# Patient Record
Sex: Male | Born: 1975
Health system: Southern US, Community
[De-identification: ages and names within clinical notes are randomized; demographics above are authoritative.]

## PROBLEM LIST (undated history)

## (undated) DIAGNOSIS — G4730 Sleep apnea, unspecified: Secondary | ICD-10-CM

## (undated) HISTORY — DX: Sleep apnea, unspecified: G47.30

---

## 2010-12-21 ENCOUNTER — Other Ambulatory Visit: Payer: Self-pay | Admitting: Internal Medicine

## 2010-12-30 ENCOUNTER — Ambulatory Visit
Admission: RE | Admit: 2010-12-30 | Discharge: 2010-12-30 | Disposition: A | Discharge status: Home / Self Care | Payer: 59 | Source: Ambulatory Visit | Attending: Internal Medicine | Admitting: Internal Medicine

## 2014-03-20 ENCOUNTER — Ambulatory Visit (INDEPENDENT_AMBULATORY_CARE_PROVIDER_SITE_OTHER): Payer: BLUE CROSS/BLUE SHIELD | Admitting: Family Medicine

## 2014-03-20 ENCOUNTER — Encounter: Payer: Self-pay | Admitting: Family Medicine

## 2014-03-20 VITALS — BP 138/82 | HR 80 | Ht 73.0 in | Wt 260.0 lb

## 2014-03-20 DIAGNOSIS — M9903 Segmental and somatic dysfunction of lumbar region: Secondary | ICD-10-CM

## 2014-03-20 DIAGNOSIS — M999 Biomechanical lesion, unspecified: Secondary | ICD-10-CM

## 2014-03-20 DIAGNOSIS — M9902 Segmental and somatic dysfunction of thoracic region: Secondary | ICD-10-CM

## 2014-03-20 DIAGNOSIS — M9904 Segmental and somatic dysfunction of sacral region: Secondary | ICD-10-CM

## 2014-03-20 DIAGNOSIS — G5702 Lesion of sciatic nerve, left lower limb: Secondary | ICD-10-CM

## 2014-03-20 NOTE — Progress Notes (Signed)
Pre visit review using our clinic review tool, if applicable. No additional management support is needed unless otherwise documented below in the visit note. 

## 2014-03-20 NOTE — Assessment & Plan Note (Signed)
Decision today to treat with OMT was based on Physical Exam  After verbal consent patient was treated with HVLA, ME techniques in cervical, lumbar and thoracic areas  Patient tolerated the procedure well with improvement in symptoms  Patient given exercises, stretches and lifestyle modifications  See medications in patient instructions if given  Patient will follow up in 3 weeks

## 2014-03-20 NOTE — Patient Instructions (Signed)
Good to see you Tennisball back left pocket with sitting Ice 20 minutes 2 times daily. Usually after activity and before bed when needed Consider Vitamin D 2000 IU daily Consider turmeric 500mg  twice daily Exercises 3 times a week.  Good shoes can always help See me agai nin 3 weeks.

## 2014-03-20 NOTE — Assessment & Plan Note (Signed)
Piriformis Syndrome  Using an anatomical model, reviewed with the patient the structures involved and how they related to diagnosis. The patient indicated understanding.   The patient was given a handout from Dr. Arne Cleveland book "The Sports Medicine Patient Advisor" describing the anatomy and rehabilitation of the following condition: Piriformis Syndrome  Also given a handout with more extensive Piriformis stretching, hip flexor and abductor strengthening, ham stretching  Rec deep massage, explained self-massage with ball  Patient will work on the hip abductor exercises and will come back and see me again in 3 weeks.

## 2014-03-20 NOTE — Progress Notes (Signed)
  [facility] [address] [place], [place] [address] Phone: [phone] Subjective:     CC: Back pain  [id number] Jeremy Warren is a 39 y.o. male coming in with complaint of  Left-sided back pain. Patient has had this pain intimately for quite some time. Patient does have the pain usually in his left side of his back and seems to radiate down his leg intermittently. Patient states recently it seems to be more frequent. Patient describes it as a dull throbbing aching sensation. Patient denies any significant weakness. This is not stopping him from any activities but it does change some of his daily activities. Patient states that he only sleeps on his right side because it feels better when he is stretching the left side. Patient has started yoga classes which has been helpful. Patient rates the severity of pain as 5 out of 10. Patient is not tried any home modalities.     Past medical history, social, surgical and family history all reviewed in electronic medical record.   Review of Systems: No headache, visual changes, nausea, vomiting, diarrhea, constipation, dizziness, abdominal pain, skin rash, fevers, chills, night sweats, weight loss, swollen lymph nodes, body aches, joint swelling, muscle aches, chest pain, shortness of breath, mood changes.   Objective Blood pressure 138/82, pulse 80, height 6\' 1"  (1.854 m), weight 260 lb (117.935 kg), SpO2 97 %.  General: No apparent distress alert and oriented x3 mood and affect normal, dressed appropriately.  HEENT: Pupils equal, extraocular movements intact  Respiratory: Patient's speak in full sentences and does not appear short of breath  Cardiovascular: No lower extremity edema, non tender, no erythema  Skin: Warm dry intact with no signs of infection or rash on extremities or on axial skeleton.  Abdomen: Soft nontender  Neuro: Cranial nerves II through XII are intact, neurovascularly intact in all  extremities with 2+ DTRs and 2+ pulses.  Lymph: No lymphadenopathy of posterior or anterior cervical chain or axillae bilaterally.  Gait normal with good balance and coordination.  MSK:  Non tender with full range of motion and good stability and symmetric strength and tone of shoulders, elbows, wrist, knee and ankles bilaterally.  Hip: Left ROM IR: 45 Deg, ER: 45 Deg, Flexion: 120 Deg, Extension: 100 Deg, Abduction: 45 Deg, Adduction: 45 Deg Strength IR: 5/5, ER: 5/5, Flexion: 5/5, Extension: 5/5, Abduction: 3/5, Adduction: 5/5 Pelvic alignment unremarkable to inspection and palpation. Standing hip rotation and gait without trendelenburg sign / unsteadiness. Greater trochanter without tenderness to palpation. Severe tenderness over the piriformis muscle on left side. Positive Faber No SI joint tenderness and normal minimal SI movement. Contralateral hip unremarkable.  OMT Physical Exam   Standing flexion left  Seated Flexion left  Cervical  Neutral  Thoracic  T5 extended rotated and side bent right  Lumbar L2 flexed rotated and side bent right  Sacrum Right on right   Procedure note 97110; 15 minutes spent for Therapeutic exercises as stated in above notes.  This included exercises focusing on stretching, strengthening, with significant focus on eccentric aspects.   Proper technique shown and discussed handout in great detail with ATC.  All questions were discussed and answered.      Impression and Recommendations:     This case required medical decision making of moderate complexity.   ;

## 2014-04-10 ENCOUNTER — Ambulatory Visit (INDEPENDENT_AMBULATORY_CARE_PROVIDER_SITE_OTHER): Payer: BLUE CROSS/BLUE SHIELD | Admitting: Family Medicine

## 2014-04-10 ENCOUNTER — Encounter: Payer: Self-pay | Admitting: Family Medicine

## 2014-04-10 VITALS — BP 132/80 | HR 82 | Ht 73.0 in | Wt 260.0 lb

## 2014-04-10 DIAGNOSIS — M9902 Segmental and somatic dysfunction of thoracic region: Secondary | ICD-10-CM

## 2014-04-10 DIAGNOSIS — M9903 Segmental and somatic dysfunction of lumbar region: Secondary | ICD-10-CM | POA: Diagnosis not present

## 2014-04-10 DIAGNOSIS — G5702 Lesion of sciatic nerve, left lower limb: Secondary | ICD-10-CM | POA: Diagnosis not present

## 2014-04-10 DIAGNOSIS — M9904 Segmental and somatic dysfunction of sacral region: Secondary | ICD-10-CM | POA: Diagnosis not present

## 2014-04-10 DIAGNOSIS — M999 Biomechanical lesion, unspecified: Secondary | ICD-10-CM

## 2014-04-10 NOTE — Progress Notes (Signed)
  [facility] [address] [place], [place] [address] Phone: [phone] Subjective:     CC: Back pain follow up  [id number] Jeremy Warren is a 39 y.o. male coming in with complaint of  Left-sided back pain. She was seen previously and had more of a piriformis syndrome. Patient was given home exercises, icing protocol and did respond well to osteopathic manipulation. Patient states he is doing about 90% better. Still very mild intermittent pain but overall feeling much better. Does respond well to the tennis ball. Doing the exercises regularly. Patient is not taking the vitamins regularly.     Past medical history, social, surgical and family history all reviewed in electronic medical record.   Review of Systems: No headache, visual changes, nausea, vomiting, diarrhea, constipation, dizziness, abdominal pain, skin rash, fevers, chills, night sweats, weight loss, swollen lymph nodes, body aches, joint swelling, muscle aches, chest pain, shortness of breath, mood changes.   Objective Blood pressure 132/80, pulse 82, height 6\' 1"  (1.854 m), weight 260 lb (117.935 kg), SpO2 97 %.  General: No apparent distress alert and oriented x3 mood and affect normal, dressed appropriately.  HEENT: Pupils equal, extraocular movements intact  Respiratory: Patient's speak in full sentences and does not appear short of breath  Cardiovascular: No lower extremity edema, non tender, no erythema  Skin: Warm dry intact with no signs of infection or rash on extremities or on axial skeleton.  Abdomen: Soft nontender  Neuro: Cranial nerves II through XII are intact, neurovascularly intact in all extremities with 2+ DTRs and 2+ pulses.  Lymph: No lymphadenopathy of posterior or anterior cervical chain or axillae bilaterally.  Gait normal with good balance and coordination.  MSK:  Non tender with full range of motion and good stability and symmetric strength and tone of shoulders,  elbows, wrist, knee and ankles bilaterally.  Hip: Left ROM IR: 45 Deg, ER: 45 Deg, Flexion: 110 Deg, Extension: 90 Deg, Abduction: 35 Deg, Adduction: 35 Deg Strength IR: 5/5, ER: 5/5, Flexion: 5/5, Extension: 5/5, Abduction: 3/5, Adduction: 5/5 Pelvic alignment unremarkable to inspection and palpation. Standing hip rotation and gait without trendelenburg sign / unsteadiness. Greater trochanter without tenderness to palpation. Severe tenderness over the piriformis muscle on left side. Positive Faber less than previous exam No SI joint tenderness and normal minimal SI movement. Contralateral hip unremarkable.  OMT Physical Exam   Cervical  Neutral  Thoracic  T5 extended rotated and side bent right  Lumbar L2 flexed rotated and side bent right  Sacrum Right on right      Impression and Recommendations:     This case required medical decision making of moderate complexity.   ;

## 2014-04-10 NOTE — Assessment & Plan Note (Signed)
Patient is doing significantly better at this time. Encourage patient to continue the home exercises 3 times a week for another 6 weeks. We discussed continuing the manual massage with the tennis ball as well. Patient does respond well to the osteopathic manipulation and we will have patient come back in 6 weeks for further evaluation and treatment.

## 2014-04-10 NOTE — Patient Instructions (Addendum)
Good to see you Tell me how you like the shoes Try maybe to get the vitamins onboard Tennis ball is your friend Continue the exercises 3 times a week See me again in 6 weeks.

## 2014-04-10 NOTE — Assessment & Plan Note (Signed)
Decision today to treat with OMT was based on Physical Exam  After verbal consent patient was treated with HVLA, ME techniques in cervical, lumbar and thoracic areas  Patient tolerated the procedure well with improvement in symptoms  Patient given exercises, stretches and lifestyle modifications  See medications in patient instructions if given  Patient will follow up in 6 weeks

## 2014-04-10 NOTE — Progress Notes (Signed)
Pre visit review using our clinic review tool, if applicable. No additional management support is needed unless otherwise documented below in the visit note. 

## 2014-05-21 ENCOUNTER — Ambulatory Visit (INDEPENDENT_AMBULATORY_CARE_PROVIDER_SITE_OTHER): Payer: BLUE CROSS/BLUE SHIELD | Admitting: Family Medicine

## 2014-05-21 ENCOUNTER — Encounter: Payer: Self-pay | Admitting: Family Medicine

## 2014-05-21 VITALS — BP 118/80 | HR 71 | Ht 73.0 in | Wt 260.0 lb

## 2014-05-21 DIAGNOSIS — G5702 Lesion of sciatic nerve, left lower limb: Secondary | ICD-10-CM | POA: Diagnosis not present

## 2014-05-21 DIAGNOSIS — M9902 Segmental and somatic dysfunction of thoracic region: Secondary | ICD-10-CM | POA: Diagnosis not present

## 2014-05-21 DIAGNOSIS — M9903 Segmental and somatic dysfunction of lumbar region: Secondary | ICD-10-CM

## 2014-05-21 DIAGNOSIS — M9904 Segmental and somatic dysfunction of sacral region: Secondary | ICD-10-CM | POA: Diagnosis not present

## 2014-05-21 DIAGNOSIS — M999 Biomechanical lesion, unspecified: Secondary | ICD-10-CM

## 2014-05-21 NOTE — Progress Notes (Signed)
Pre visit review using our clinic review tool, if applicable. No additional management support is needed unless otherwise documented below in the visit note. 

## 2014-05-21 NOTE — Progress Notes (Signed)
  [facility] [address] [place], [place] [address] Phone: [phone] Subjective:     CC: Back pain follow up  [id number] Martino Tompson is a 39 y.o. male coming in with complaint of  Left-sided back pain. She was seen previously and had more of a piriformis syndrome. Patient continues to do very well. Patient is having no pain down the leg. Patient states that it is very well controlled. Not having any issues this time.     Past medical history, social, surgical and family history all reviewed in electronic medical record.   Review of Systems: No headache, visual changes, nausea, vomiting, diarrhea, constipation, dizziness, abdominal pain, skin rash, fevers, chills, night sweats, weight loss, swollen lymph nodes, body aches, joint swelling, muscle aches, chest pain, shortness of breath, mood changes.   Objective Blood pressure 118/80, pulse 71, height 6\' 1"  (1.854 m), weight 260 lb (117.935 kg), SpO2 97 %.  General: No apparent distress alert and oriented x3 mood and affect normal, dressed appropriately.  HEENT: Pupils equal, extraocular movements intact  Respiratory: Patient's speak in full sentences and does not appear short of breath  Cardiovascular: No lower extremity edema, non tender, no erythema  Skin: Warm dry intact with no signs of infection or rash on extremities or on axial skeleton.  Abdomen: Soft nontender  Neuro: Cranial nerves II through XII are intact, neurovascularly intact in all extremities with 2+ DTRs and 2+ pulses.  Lymph: No lymphadenopathy of posterior or anterior cervical chain or axillae bilaterally.  Gait normal with good balance and coordination.  MSK:  Non tender with full range of motion and good stability and symmetric strength and tone of shoulders, elbows, wrist, knee and ankles bilaterally.  Hip: Left ROM IR: 45 Deg, ER: 45 Deg, Flexion: 110 Deg, Extension: 90 Deg, Abduction: 35 Deg, Adduction: 35  Deg Strength IR: 5/5, ER: 5/5, Flexion: 5/5, Extension: 5/5, Abduction: 3/5, Adduction: 5/5 Pelvic alignment unremarkable to inspection and palpation. Standing hip rotation and gait without trendelenburg sign / unsteadiness. Greater trochanter without tenderness to palpation. nontender on the piriformis Positive Corky Sox less than previous exam No SI joint tenderness and normal minimal SI movement. Contralateral hip unremarkable.  OMT Physical Exam   Cervical  Neutral  Thoracic  T5 extended rotated and side bent right  Lumbar L2 flexed rotated and side bent right  Sacrum Right on right      Impression and Recommendations:     This case required medical decision making of moderate complexity.   ;

## 2014-05-21 NOTE — Assessment & Plan Note (Signed)
Continues to do remarkably well with conservative therapy. Discussed icing regimen and home exercises. Patient will continue to make these different changes and will see me again in 8 weeks. Patient otherwise as long as he continues to do well we'll see me on an as-needed basis if he doesn't have pain in 8 weeks.

## 2014-05-21 NOTE — Assessment & Plan Note (Signed)
Decision today to treat with OMT was based on Physical Exam  After verbal consent patient was treated with HVLA, ME techniques in cervical, lumbar and thoracic areas  Patient tolerated the procedure well with improvement in symptoms  Patient given exercises, stretches and lifestyle modifications  See medications in patient instructions if given  Patient will follow up in 8 weeks

## 2014-05-21 NOTE — Patient Instructions (Signed)
Making my job easy Continue what you are doing Continue the vitamins Sorry about your wife on the turmeric See me in 8 weeks.

## 2014-07-16 ENCOUNTER — Ambulatory Visit (INDEPENDENT_AMBULATORY_CARE_PROVIDER_SITE_OTHER): Payer: BLUE CROSS/BLUE SHIELD | Admitting: Family Medicine

## 2014-07-16 ENCOUNTER — Encounter: Payer: Self-pay | Admitting: Family Medicine

## 2014-07-16 VITALS — BP 122/82 | HR 74 | Ht 73.0 in | Wt 250.0 lb

## 2014-07-16 DIAGNOSIS — M9904 Segmental and somatic dysfunction of sacral region: Secondary | ICD-10-CM

## 2014-07-16 DIAGNOSIS — M9903 Segmental and somatic dysfunction of lumbar region: Secondary | ICD-10-CM | POA: Diagnosis not present

## 2014-07-16 DIAGNOSIS — G5702 Lesion of sciatic nerve, left lower limb: Secondary | ICD-10-CM

## 2014-07-16 DIAGNOSIS — M9902 Segmental and somatic dysfunction of thoracic region: Secondary | ICD-10-CM

## 2014-07-16 DIAGNOSIS — M999 Biomechanical lesion, unspecified: Secondary | ICD-10-CM

## 2014-07-16 NOTE — Patient Instructions (Signed)
Good to see you\ You are doing great No changes continue the exercises See me again in 2-3 months.

## 2014-07-16 NOTE — Progress Notes (Signed)
  [facility] [address] [place], [place] [address] Phone: [phone] Subjective:     CC: Back pain follow up  [id number] Jeremy Warren is a 39 y.o. male coming in with complaint of  Left-sided back pain. She was seen previously and had more of a piriformis syndrome. Patient continues to do very well. Patient is having no pain down the leg. Patient states he is really not having any significant pain. Patient continues to do her activity he feels fit. Denies any numbness or tingling. Denies any radiation down the leg..     Past medical history, social, surgical and family history all reviewed in electronic medical record.   Review of Systems: No headache, visual changes, nausea, vomiting, diarrhea, constipation, dizziness, abdominal pain, skin rash, fevers, chills, night sweats, weight loss, swollen lymph nodes, body aches, joint swelling, muscle aches, chest pain, shortness of breath, mood changes.   Objective Blood pressure 122/82, pulse 74, height 6\' 1"  (1.854 m), weight 250 lb (113.399 kg), SpO2 98 %.  General: No apparent distress alert and oriented x3 mood and affect normal, dressed appropriately.  HEENT: Pupils equal, extraocular movements intact  Respiratory: Patient's speak in full sentences and does not appear short of breath  Cardiovascular: No lower extremity edema, non tender, no erythema  Skin: Warm dry intact with no signs of infection or rash on extremities or on axial skeleton.  Abdomen: Soft nontender  Neuro: Cranial nerves II through XII are intact, neurovascularly intact in all extremities with 2+ DTRs and 2+ pulses.  Lymph: No lymphadenopathy of posterior or anterior cervical chain or axillae bilaterally.  Gait normal with good balance and coordination.  MSK:  Non tender with full range of motion and good stability and symmetric strength and tone of shoulders, elbows, wrist, knee and ankles bilaterally.  Hip: Left ROM IR: 25  Deg, ER: 45 Deg, Flexion: 110 Deg, Extension: 90 Deg, Abduction: 35 Deg, Adduction: 35 Deg Strength IR: 5/5, ER: 5/5, Flexion: 5/5, Extension: 5/5, Abduction: 4/5, Adduction: 5/5 Pelvic alignment unremarkable to inspection and palpation. Standing hip rotation and gait without trendelenburg sign / unsteadiness. Greater trochanter without tenderness to palpation. nontender on the piriformis Continued improvement with Corky Sox No SI joint tenderness and normal minimal SI movement. Contralateral hip unremarkable. Back his full range of motion. Nontender over the paraspinal musculature.  OMT Physical Exam   Cervical  C2 flexed rotated and side bent left  Thoracic  T5 extended rotated and side bent right  Lumbar L2 flexed rotated and side bent right L4 flexed rotated and side bent left  Sacrum Right on right      Impression and Recommendations:     This case required medical decision making of moderate complexity.   ;

## 2014-07-16 NOTE — Assessment & Plan Note (Signed)
This and is doing markedly well at this time. Discussed icing regimen and home exercises. Discussed avoiding any significant repetitive motion if possible without doing the stretches. We discussed what activities to be beneficial and continue to work on core strengthening the hip abductors. Patient come back and see me again in 2-3 months for further evaluation and treatment.

## 2014-07-16 NOTE — Assessment & Plan Note (Signed)
Decision today to treat with OMT was based on Physical Exam  After verbal consent patient was treated with HVLA, ME techniques in cervical, lumbar and thoracic areas  Patient tolerated the procedure well with improvement in symptoms  Patient given exercises, stretches and lifestyle modifications  See medications in patient instructions if given  Patient will follow up in 8-12 weeks

## 2014-07-16 NOTE — Progress Notes (Signed)
Pre visit review using our clinic review tool, if applicable. No additional management support is needed unless otherwise documented below in the visit note. 

## 2014-09-09 ENCOUNTER — Encounter: Payer: Self-pay | Admitting: Family Medicine

## 2014-09-09 ENCOUNTER — Ambulatory Visit (INDEPENDENT_AMBULATORY_CARE_PROVIDER_SITE_OTHER): Payer: BLUE CROSS/BLUE SHIELD | Admitting: Family Medicine

## 2014-09-09 VITALS — BP 128/80 | HR 74 | Ht 73.0 in | Wt 250.0 lb

## 2014-09-09 DIAGNOSIS — G5702 Lesion of sciatic nerve, left lower limb: Secondary | ICD-10-CM

## 2014-09-09 DIAGNOSIS — M778 Other enthesopathies, not elsewhere classified: Secondary | ICD-10-CM

## 2014-09-09 DIAGNOSIS — M999 Biomechanical lesion, unspecified: Secondary | ICD-10-CM

## 2014-09-09 DIAGNOSIS — M9903 Segmental and somatic dysfunction of lumbar region: Secondary | ICD-10-CM | POA: Diagnosis not present

## 2014-09-09 DIAGNOSIS — M779 Enthesopathy, unspecified: Secondary | ICD-10-CM

## 2014-09-09 DIAGNOSIS — M9904 Segmental and somatic dysfunction of sacral region: Secondary | ICD-10-CM | POA: Diagnosis not present

## 2014-09-09 DIAGNOSIS — M9902 Segmental and somatic dysfunction of thoracic region: Secondary | ICD-10-CM | POA: Diagnosis not present

## 2014-09-09 NOTE — Assessment & Plan Note (Signed)
Mild in nature. Discussed with patient about conservative therapy including compression, icing, short course of anti-inflammatory's, and what activities to potentially avoid. Patient will likely stop doing a lot of lifting which likely exacerbated the situation. Patient come back in 4-6 weeks to make sure he is resolving.

## 2014-09-09 NOTE — Assessment & Plan Note (Signed)
Doing significantly better at this time. Encourage him to continue to do the exercises on areolar basis. Encourage core strengthening exercises that think with beneficial. We'll see patient back again in 4-6 weeks for further evaluation and treatment

## 2014-09-09 NOTE — Patient Instructions (Addendum)
Great to see you as always.  You are doing great and keep it up! Ice when you need it! I am glad you are done with the garage Ibuprofen 600mg  3 times a day for next 3 days Consider compression sleeve to the arm  See me again in 4-6 weeks.

## 2014-09-09 NOTE — Progress Notes (Signed)
[facility] [address] [place], [place] [address] Phone: [phone] Subjective:     CC: Back pain follow up  [id number] Jeremy Warren is a 39 y.o. male coming in with complaint of  Left-sided back pain. Diagnosed with piriformis syndrome previously. Patient though has been very faithful doing conservative therapy including home exercises, icing, and over-the-counter natural supplementations. Patient was not having any difficulty doing the activities and has remained active. Patient states overall his back is been doing relatively well. 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 Patient states doing this Some Discomfort but Nothing Severe. As Long As He Continues See Over-The-Counter Natural Supplements under the Exercises Regularly He Feels like He Is Doing Well.  In addition of this patient does have a new problem. Patient states that he is having more of a left elbow pain. Seems to be on the posterior aspect. Worsening over this weekend after moving boxes. Patient denies though any other lifting that seemed to be giving him any trouble. Describes it as a dull aching sensation that was intermittently for months but seems to be worse now. Does not remember any true injury. Denies any radiation down the arm or any weakness. Rates the severity of pain is 4 out of 10.    Past medical history, social, surgical and family history all reviewed in electronic medical record.   Review of Systems: No headache, visual changes, nausea, vomiting, diarrhea, constipation, dizziness, abdominal pain, skin rash, fevers, chills, night sweats, weight loss, swollen lymph nodes, body aches, joint swelling, muscle aches, chest pain, shortness of breath, mood changes.   Objective Blood pressure 128/80, pulse 74, height 6\' 1"  (1.854 m), weight 250 lb (113.399 kg), SpO2 97 %.  General: No apparent distress alert and oriented x3 mood and affect normal, dressed  appropriately.  HEENT: Pupils equal, extraocular movements intact  Respiratory: Patient's speak in full sentences and does not appear short of breath  Cardiovascular: No lower extremity edema, non tender, no erythema  Skin: Warm dry intact with no signs of infection or rash on extremities or on axial skeleton.  Abdomen: Soft nontender  Neuro: Cranial nerves II through XII are intact, neurovascularly intact in all extremities with 2+ DTRs and 2+ pulses.  Lymph: No lymphadenopathy of posterior or anterior cervical chain or axillae bilaterally.  Gait normal with good balance and coordination.  MSK:  Non tender with full range of motion and good stability and symmetric strength and tone of shoulders, wrist, knee and ankles bilaterally.  Elbow: Left Unremarkable to inspection. Range of motion full pronation, supination, flexion, extension. Strength is full to all of the above directions Stable to varus, valgus stress. Negative moving valgus stress test. Minimal tenderness over the insertion of the tricep but no pain with strength. No mass or swelling palpated. Ulnar nerve does not sublux. Negative cubital tunnel Tinel's. Contralateral elbow unremarkable Hip: Left ROM IR: 25 Deg, ER: 45 Deg, Flexion: 110 Deg, Extension: 90 Deg, Abduction: 35 Deg, Adduction: 35 Deg Strength IR: 5/5, ER: 5/5, Flexion: 5/5, Extension: 5/5, Abduction: 4+/5 mild improvement, Adduction: 5/5 Pelvic alignment unremarkable to inspection and palpation. Standing hip rotation and gait without trendelenburg sign / unsteadiness. Greater trochanter without tenderness to palpation. nontender on the piriformis Continued positive Corky Sox but only mild and mild discomfort over the sacroiliac joint. Contralateral hip unremarkable. Back his full range of motion. Nontender over the paraspinal musculature.  OMT Physical Exam   Cervical  C2 flexed  rotated and side bent left  Thoracic  T5 extended rotated and side bent  right  Lumbar L2 flexed rotated and side bent right  Sacrum Right on right Mild improvement pattern from previous exam     Impression and Recommendations:     This case required medical decision making of moderate complexity.   ;

## 2014-09-09 NOTE — Assessment & Plan Note (Signed)
Decision today to treat with OMT was based on Physical Exam  After verbal consent patient was treated with HVLA, ME techniques in cervical, lumbar and thoracic areas  Patient tolerated the procedure well with improvement in symptoms  Patient given exercises, stretches and lifestyle modifications  See medications in patient instructions if given  Patient will follow up in 4-6 weeks

## 2014-09-09 NOTE — Progress Notes (Signed)
Pre visit review using our clinic review tool, if applicable. No additional management support is needed unless otherwise documented below in the visit note. 

## 2014-10-09 ENCOUNTER — Ambulatory Visit (INDEPENDENT_AMBULATORY_CARE_PROVIDER_SITE_OTHER): Payer: BLUE CROSS/BLUE SHIELD | Admitting: Family Medicine

## 2014-10-09 ENCOUNTER — Encounter: Payer: Self-pay | Admitting: Family Medicine

## 2014-10-09 VITALS — BP 118/8 | HR 76 | Ht 73.0 in | Wt 250.0 lb

## 2014-10-09 DIAGNOSIS — G5702 Lesion of sciatic nerve, left lower limb: Secondary | ICD-10-CM

## 2014-10-09 DIAGNOSIS — M9902 Segmental and somatic dysfunction of thoracic region: Secondary | ICD-10-CM

## 2014-10-09 DIAGNOSIS — M9904 Segmental and somatic dysfunction of sacral region: Secondary | ICD-10-CM

## 2014-10-09 DIAGNOSIS — M9903 Segmental and somatic dysfunction of lumbar region: Secondary | ICD-10-CM

## 2014-10-09 DIAGNOSIS — M999 Biomechanical lesion, unspecified: Secondary | ICD-10-CM

## 2014-10-09 NOTE — Progress Notes (Signed)
  [facility] [address], [place] [address] Phone: [phone] Subjective:     CC: Back pain follow up  [id number] Jeremy Warren is a 39 y.o. male coming in with complaint of  Left-sided back pain. Diagnosed with piriformis syndrome previously. Doing significantly better at this time. Doing much better overall. Not having any significant difficulties.   Patient is a's follow-up was also found because of tendinitis. Patient was to do compression, home exercises, icing and anti-inflammatory's. Patient states overall he is doing very well. Patient states that he is not having and tendinitis of the tricep. Patient did do the exercises for one week and then it seemed to resolve and has not been doing it regularly.    Past medical history, social, surgical and family history all reviewed in electronic medical record.   Review of Systems: No headache, visual changes, nausea, vomiting, diarrhea, constipation, dizziness, abdominal pain, skin rash, fevers, chills, night sweats, weight loss, swollen lymph nodes, body aches, joint swelling, muscle aches, chest pain, shortness of breath, mood changes.   Objective Blood pressure 118/8, pulse 76, height 6\' 1"  (1.854 m), weight 250 lb (113.399 kg), SpO2 98 %.  General: No apparent distress alert and oriented x3 mood and affect normal, dressed appropriately.  HEENT: Pupils equal, extraocular movements intact  Respiratory: Patient's speak in full sentences and does not appear short of breath  Cardiovascular: No lower extremity edema, non tender, no erythema  Skin: Warm dry intact with no signs of infection or rash on extremities or on axial skeleton.  Abdomen: Soft nontender  Neuro: Cranial nerves II through XII are intact, neurovascularly intact in all extremities with 2+ DTRs and 2+ pulses.  Lymph: No lymphadenopathy of posterior or anterior cervical chain or axillae bilaterally.  Gait normal with good  balance and coordination.  MSK:  Non tender with full range of motion and good stability and symmetric strength and tone of shoulders, wrist, knee and ankles bilaterally.  Elbow: Left Unremarkable to inspection. Range of motion full pronation, supination, flexion, extension. Strength is full to all of the above directions Stable to varus, valgus stress. Negative moving valgus stress test. Nontender over the trapezius Ulnar nerve does not sublux. Negative cubital tunnel Tinel's. Contralateral elbow unremarkable Hip: Left ROM IR: 25 Deg, ER: 45 Deg, Flexion: 110 Deg, Extension: 90 Deg, Abduction: 35 Deg, Adduction: 35 Deg Strength IR: 5/5, ER: 5/5, Flexion: 5/5, Extension: 5/5, Abduction: 4+/5 mild improvement, Adduction: 5/5 Pelvic alignment unremarkable to inspection and palpation. Standing hip rotation and gait without trendelenburg sign / unsteadiness. Greater trochanter without tenderness to palpation. nontender on the piriformis Mild tightness with Corky Sox but no pain Contralateral hip unremarkable. Back his full range of motion. Nontender over the paraspinal musculature.  OMT Physical Exam   Cervical  C2 flexed rotated and side bent left  Thoracic  T5 extended rotated and side bent right  Lumbar L2 flexed rotated and side bent right  Sacrum Right on right Continued improvement     Impression and Recommendations:     This case required medical decision making of moderate complexity.   ;

## 2014-10-09 NOTE — Progress Notes (Signed)
Pre visit review using our clinic review tool, if applicable. No additional management support is needed unless otherwise documented below in the visit note. 

## 2014-10-09 NOTE — Assessment & Plan Note (Signed)
Patient doing significant a better with the piriformis syndrome. I do think that his lungs patient continues to stay active and do the stretches he can likely follow-up with me in 6-8 weeks. Patient will likely go to 3 month intervals in the near future. Discuss again the importance of core strengthening abductor strengthening. Patient does not have anything that is stopping her from daily activities. Overall happy with the results.

## 2014-10-09 NOTE — Assessment & Plan Note (Signed)
Decision today to treat with OMT was based on Physical Exam  After verbal consent patient was treated with HVLA, ME techniques in cervical, lumbar and thoracic areas  Patient tolerated the procedure well with improvement in symptoms  Patient given exercises, stretches and lifestyle modifications  See medications in patient instructions if given  Patient will follow up in 6 weeks    

## 2014-10-09 NOTE — Patient Instructions (Addendum)
Good to see you! Tell your better half hello Good luck with the boys You are doing great Nothing new If elbow starts again you know what to do  See me again in 6-8 weeks.

## 2014-11-20 ENCOUNTER — Encounter: Payer: Self-pay | Admitting: Family Medicine

## 2014-11-20 ENCOUNTER — Ambulatory Visit (INDEPENDENT_AMBULATORY_CARE_PROVIDER_SITE_OTHER): Payer: BLUE CROSS/BLUE SHIELD | Admitting: Family Medicine

## 2014-11-20 VITALS — BP 130/82 | HR 87 | Ht 73.0 in | Wt 267.0 lb

## 2014-11-20 DIAGNOSIS — M9904 Segmental and somatic dysfunction of sacral region: Secondary | ICD-10-CM

## 2014-11-20 DIAGNOSIS — M9903 Segmental and somatic dysfunction of lumbar region: Secondary | ICD-10-CM | POA: Diagnosis not present

## 2014-11-20 DIAGNOSIS — G5702 Lesion of sciatic nerve, left lower limb: Secondary | ICD-10-CM

## 2014-11-20 DIAGNOSIS — M9902 Segmental and somatic dysfunction of thoracic region: Secondary | ICD-10-CM | POA: Diagnosis not present

## 2014-11-20 DIAGNOSIS — M999 Biomechanical lesion, unspecified: Secondary | ICD-10-CM

## 2014-11-20 NOTE — Assessment & Plan Note (Signed)
Patient is doing remarkably well at this time. Encourage patient to continue to do the exercises on a regular basis some protocol. Patient will continue with the vitamin supplementations. Patient will do this and come back and see me again in 6-8 weeks for further evaluation and treatment.

## 2014-11-20 NOTE — Patient Instructions (Addendum)
Great to see you as always.  Ice is your friend Stay active and continue to do what you do.  Have a great holiday season.  See me again in 6-8 weeks.

## 2014-11-20 NOTE — Progress Notes (Signed)
  [facility] [place], [place] [address] Phone: [phone] Subjective:     CC: Back pain follow up  [id number] Jeremy Warren is a 39 y.o. male coming in with complaint of  Left-sided back pain. Diagnosed with piriformis syndrome previously. Doing significantly better at this time. Doing much better overall. Not having any significant difficulties. Patient remains active. Continues to work on core strength. Continues over-the-counter natural supplementations. Patient states overall he is doing markedly well. Not having any significant pain. Nothing that is stopping him from any daily activities. Patient is very happy with the results.  Patient was having some more triceps tendinitis but had completely resolved at last follow-up.    Past medical history, social, surgical and family history all reviewed in electronic medical record.   Review of Systems: No headache, visual changes, nausea, vomiting, diarrhea, constipation, dizziness, abdominal pain, skin rash, fevers, chills, night sweats, weight loss, swollen lymph nodes, body aches, joint swelling, muscle aches, chest pain, shortness of breath, mood changes.   Objective Blood pressure 130/82, pulse 87, height 6\' 1"  (1.854 m), weight 267 lb (121.11 kg), SpO2 98 %.  General: No apparent distress alert and oriented x3 mood and affect normal, dressed appropriately.  HEENT: Pupils equal, extraocular movements intact  Respiratory: Patient's speak in full sentences and does not appear short of breath  Cardiovascular: No lower extremity edema, non tender, no erythema  Skin: Warm dry intact with no signs of infection or rash on extremities or on axial skeleton.  Abdomen: Soft nontender  Neuro: Cranial nerves II through XII are intact, neurovascularly intact in all extremities with 2+ DTRs and 2+ pulses.  Lymph: No lymphadenopathy of posterior or anterior cervical chain or axillae bilaterally.  Gait  normal with good balance and coordination.  MSK:  Non tender with full range of motion and good stability and symmetric strength and tone of shoulders, wrist, knee and ankles bilaterally.  Elbow: Left Unremarkable to inspection. Range of motion full pronation, supination, flexion, extension. Strength is full to all of the above directions Stable to varus, valgus stress. Negative moving valgus stress test. Nontender over the trapezius Ulnar nerve does not sublux. Negative cubital tunnel Tinel's. Contralateral elbow unremarkable Hip: Left ROM IR: 25 Deg, ER: 45 Deg, Flexion: 110 Deg, Extension: 90 Deg, Abduction: 35 Deg, Adduction: 35 Deg Strength IR: 5/5, ER: 5/5, Flexion: 5/5, Extension: 5/5, Abduction: 4+/5 mild improvement, Adduction: 5/5 Pelvic alignment unremarkable to inspection and palpation. Standing hip rotation and gait without trendelenburg sign / unsteadiness. Greater trochanter without tenderness to palpation. nontender on the piriformis Full range of motion and negative Faber Contralateral hip unremarkable. Back his full range of motion. Nontender over the paraspinal musculature.  OMT Physical Exam   Cervical  C2 flexed rotated and side bent left  Thoracic T3 extended rotated and side bent left  T5 extended rotated and side bent right  Lumbar L2 flexed rotated and side bent right  Sacrum Right on right      Impression and Recommendations:     This case required medical decision making of moderate complexity.   ;

## 2014-11-20 NOTE — Progress Notes (Signed)
Pre visit review using our clinic review tool, if applicable. No additional management support is needed unless otherwise documented below in the visit note. 

## 2014-11-20 NOTE — Assessment & Plan Note (Signed)
Decision today to treat with OMT was based on Physical Exam  After verbal consent patient was treated with HVLA, ME techniques in cervical, lumbar and thoracic areas  Patient tolerated the procedure well with improvement in symptoms  Patient given exercises, stretches and lifestyle modifications  See medications in patient instructions if given  Patient will follow up in 6-8 weeks

## 2015-01-15 ENCOUNTER — Ambulatory Visit: Payer: BLUE CROSS/BLUE SHIELD | Admitting: Family Medicine

## 2016-06-09 DIAGNOSIS — Z0000 Encounter for general adult medical examination without abnormal findings: Secondary | ICD-10-CM | POA: Diagnosis not present

## 2016-06-09 DIAGNOSIS — R7301 Impaired fasting glucose: Secondary | ICD-10-CM | POA: Diagnosis not present

## 2016-06-16 DIAGNOSIS — Z1389 Encounter for screening for other disorder: Secondary | ICD-10-CM | POA: Diagnosis not present

## 2016-06-16 DIAGNOSIS — E784 Other hyperlipidemia: Secondary | ICD-10-CM | POA: Diagnosis not present

## 2016-06-16 DIAGNOSIS — R030 Elevated blood-pressure reading, without diagnosis of hypertension: Secondary | ICD-10-CM | POA: Diagnosis not present

## 2016-06-16 DIAGNOSIS — Z0000 Encounter for general adult medical examination without abnormal findings: Secondary | ICD-10-CM | POA: Diagnosis not present

## 2016-06-16 DIAGNOSIS — R7301 Impaired fasting glucose: Secondary | ICD-10-CM | POA: Diagnosis not present

## 2016-09-28 ENCOUNTER — Ambulatory Visit (HOSPITAL_COMMUNITY): Payer: BLUE CROSS/BLUE SHIELD | Admitting: Cardiology

## 2016-09-29 ENCOUNTER — Ambulatory Visit (HOSPITAL_COMMUNITY): Payer: BLUE CROSS/BLUE SHIELD | Admitting: Cardiology

## 2016-10-29 ENCOUNTER — Encounter (HOSPITAL_COMMUNITY): Payer: Self-pay | Admitting: Cardiology

## 2016-10-29 ENCOUNTER — Ambulatory Visit (HOSPITAL_COMMUNITY)
Admission: RE | Admit: 2016-10-29 | Discharge: 2016-10-29 | Disposition: A | Discharge status: Home / Self Care | Payer: 59 | Source: Ambulatory Visit | Attending: Cardiology | Admitting: Cardiology

## 2016-10-29 VITALS — BP 138/88 | HR 74 | Ht 73.0 in | Wt 272.4 lb

## 2016-10-29 DIAGNOSIS — E782 Mixed hyperlipidemia: Secondary | ICD-10-CM

## 2016-10-29 DIAGNOSIS — E785 Hyperlipidemia, unspecified: Secondary | ICD-10-CM | POA: Insufficient documentation

## 2016-10-29 DIAGNOSIS — Z8249 Family history of ischemic heart disease and other diseases of the circulatory system: Secondary | ICD-10-CM | POA: Diagnosis not present

## 2016-10-29 DIAGNOSIS — Z8042 Family history of malignant neoplasm of prostate: Secondary | ICD-10-CM | POA: Diagnosis not present

## 2016-10-29 DIAGNOSIS — Z8261 Family history of arthritis: Secondary | ICD-10-CM | POA: Insufficient documentation

## 2016-10-29 DIAGNOSIS — F458 Other somatoform disorders: Secondary | ICD-10-CM | POA: Diagnosis not present

## 2016-10-29 DIAGNOSIS — R030 Elevated blood-pressure reading, without diagnosis of hypertension: Secondary | ICD-10-CM | POA: Insufficient documentation

## 2016-10-29 DIAGNOSIS — Z79899 Other long term (current) drug therapy: Secondary | ICD-10-CM | POA: Diagnosis not present

## 2016-10-29 NOTE — Patient Instructions (Signed)
Follow up as needed

## 2016-10-31 NOTE — Progress Notes (Signed)
PCP: Dr. Brigitte Pulse Cardiology: Dr. Aundra Dubin  41 yo with minimal past history presents for cardiac evaluation given episodes of elevated BP and a full sensation in his throat.  Patient was doing well until a couple of months ago when he began to develop a "full" sensation in his throat.  This was not related to exertion and could come on at any time.  It seemed to correlate with increased anxiety just before the birth of his 4th child.  More recently, this sensation has resolved.  His BP was above 379 systolic several times during that period, but now SBP runs primarily in the 130s (uses his wife's BP cuff to check).  His wife has noted him to snore and gasp in his sleep though he denies daytime sleepiness.  He has gained about 10 lbs over the last year.  He also notes tingling in his right hand with repetitive activity and sometimes notes it when he wakes from sleep in the morning.  He has tried wearing a carpal tunnel splint and finds that this helps.   I obtained his lipids from PCP from 5/18, LDL is elevated at 158.  He denies exertional chest pain/tightness.  No exertional dyspnea, normal exercise tolerance.   ECG (personally reviewed): NSR, normal (from PCP's office).   PMH: 1. Borderline hypertension 2. Suspect OSA 3. Hyperlipidemia  Social History   Social History  . Marital status: Married    Spouse name: N/A  . Number of children: N/A  . Years of education: N/A   Occupational History  . Not on file.   Social History Main Topics  . Smoking status: Never Smoker  . Smokeless tobacco: Never Used  . Alcohol use Not on file  . Drug use: Unknown  . Sexual activity: Not on file   Other Topics Concern  . Not on file   Social History Narrative  . No narrative on file   FH: Grandfathers with CABG in their 55s.  H/o pancreatic cancer.   ROS: All systems reviewed and negative except as per HPI.   Current Outpatient Prescriptions  Medication Sig Dispense Refill  . albuterol (PROVENTIL  HFA;VENTOLIN HFA) 108 (90 Base) MCG/ACT inhaler Inhale 1-2 puffs into the lungs every 6 (six) hours as needed for wheezing or shortness of breath.     No current facility-administered medications for this encounter.    BP 138/88 (BP Location: Right Arm, Patient Position: Sitting, Cuff Size: Normal)   Pulse 74   Ht 6\' 1"  (1.854 m)   Wt 272 lb 6.4 oz (123.6 kg)   SpO2 98%   BMI 35.94 kg/m  General: NAD, overweight Neck: No JVD, no thyromegaly or thyroid nodule.  Lungs: Clear to auscultation bilaterally with normal respiratory effort. CV: Nondisplaced PMI.  Heart regular S1/S2, no S3/S4, no murmur.  No peripheral edema.  No carotid bruit.  Normal pedal pulses.  Abdomen: Soft, nontender, no hepatosplenomegaly, no distention.  Skin: Intact without lesions or rashes.  Neurologic: Alert and oriented x 3.  Psych: Normal affect. Extremities: No clubbing or cyanosis.  HEENT: Normal.   Assessment/Plan: 1. Elevated BP: SBP in 130s at times.  By newest data, would like to see below 130/80.  Would not recommend pharmacologic treatment.  Think he can get his BP down with weight loss.  We discussed diet/increased exercise with initial goal weight around 250 lbs.  2. Globus sensation: I think the fullness in his throat that he was feeling prior to his son's birth was a globus  sensation due to anxiety.  This has resolved.  3. Hyperlipidemia: LDL was high at 158.  He does not have a family history of premature CAD.  He could consider statin treatment but is somewhat borderline for this.  I think it would be reasonable for him to get a coronary calcium score to aid in risk stratification, will arrange.  4. Tingling in hand: Sounds like early carpal tunnel syndrome.  5. Suspect OSA: Strong suspicion for a degree of OSA.  He will work on weight loss before we order a sleep study.   Loralie Champagne 10/31/2016

## 2016-11-01 ENCOUNTER — Telehealth (HOSPITAL_COMMUNITY): Payer: Self-pay | Admitting: *Deleted

## 2016-11-01 DIAGNOSIS — Z9189 Other specified personal risk factors, not elsewhere classified: Secondary | ICD-10-CM

## 2016-11-01 NOTE — Telephone Encounter (Signed)
Dr. Aundra Dubin has ordered for patient to have Calcium Score CT Scan done.  Patient is aware, order placed and message sent to scheduling to contact patient.

## 2016-11-03 DIAGNOSIS — Z23 Encounter for immunization: Secondary | ICD-10-CM | POA: Diagnosis not present

## 2016-11-09 ENCOUNTER — Telehealth (HOSPITAL_COMMUNITY): Payer: Self-pay | Admitting: Vascular Surgery

## 2016-11-09 NOTE — Telephone Encounter (Signed)
Encounter open in error 

## 2016-11-23 ENCOUNTER — Ambulatory Visit (INDEPENDENT_AMBULATORY_CARE_PROVIDER_SITE_OTHER)
Admission: RE | Admit: 2016-11-23 | Discharge: 2016-11-23 | Disposition: A | Discharge status: Home / Self Care | Payer: Self-pay | Source: Ambulatory Visit | Attending: Cardiology | Admitting: Cardiology

## 2016-11-23 DIAGNOSIS — Z9189 Other specified personal risk factors, not elsewhere classified: Secondary | ICD-10-CM

## 2017-06-16 DIAGNOSIS — R82998 Other abnormal findings in urine: Secondary | ICD-10-CM | POA: Diagnosis not present

## 2017-06-16 DIAGNOSIS — E7849 Other hyperlipidemia: Secondary | ICD-10-CM | POA: Diagnosis not present

## 2017-06-16 DIAGNOSIS — R7301 Impaired fasting glucose: Secondary | ICD-10-CM | POA: Diagnosis not present

## 2017-06-16 DIAGNOSIS — Z0000 Encounter for general adult medical examination without abnormal findings: Secondary | ICD-10-CM | POA: Diagnosis not present

## 2017-06-23 DIAGNOSIS — R7301 Impaired fasting glucose: Secondary | ICD-10-CM | POA: Diagnosis not present

## 2017-06-23 DIAGNOSIS — R030 Elevated blood-pressure reading, without diagnosis of hypertension: Secondary | ICD-10-CM | POA: Diagnosis not present

## 2017-06-23 DIAGNOSIS — Z1389 Encounter for screening for other disorder: Secondary | ICD-10-CM | POA: Diagnosis not present

## 2017-06-23 DIAGNOSIS — E7849 Other hyperlipidemia: Secondary | ICD-10-CM | POA: Diagnosis not present

## 2017-06-23 DIAGNOSIS — Z0000 Encounter for general adult medical examination without abnormal findings: Secondary | ICD-10-CM | POA: Diagnosis not present

## 2017-07-18 DIAGNOSIS — D485 Neoplasm of uncertain behavior of skin: Secondary | ICD-10-CM | POA: Diagnosis not present

## 2017-07-18 DIAGNOSIS — D2239 Melanocytic nevi of other parts of face: Secondary | ICD-10-CM | POA: Diagnosis not present

## 2017-07-18 DIAGNOSIS — D2261 Melanocytic nevi of right upper limb, including shoulder: Secondary | ICD-10-CM | POA: Diagnosis not present

## 2017-07-18 DIAGNOSIS — D224 Melanocytic nevi of scalp and neck: Secondary | ICD-10-CM | POA: Diagnosis not present

## 2017-07-18 DIAGNOSIS — D1801 Hemangioma of skin and subcutaneous tissue: Secondary | ICD-10-CM | POA: Diagnosis not present

## 2017-11-07 DIAGNOSIS — Z23 Encounter for immunization: Secondary | ICD-10-CM | POA: Diagnosis not present

## 2018-03-08 ENCOUNTER — Encounter (HOSPITAL_COMMUNITY): Payer: Self-pay | Admitting: Cardiology

## 2018-03-08 ENCOUNTER — Other Ambulatory Visit: Payer: Self-pay

## 2018-03-08 ENCOUNTER — Ambulatory Visit (HOSPITAL_COMMUNITY)
Admission: RE | Admit: 2018-03-08 | Discharge: 2018-03-08 | Disposition: A | Discharge status: Home / Self Care | Payer: 59 | Source: Ambulatory Visit | Attending: Cardiology | Admitting: Cardiology

## 2018-03-08 VITALS — BP 140/90 | HR 80 | Wt 272.2 lb

## 2018-03-08 DIAGNOSIS — E782 Mixed hyperlipidemia: Secondary | ICD-10-CM

## 2018-03-08 DIAGNOSIS — K760 Fatty (change of) liver, not elsewhere classified: Secondary | ICD-10-CM | POA: Insufficient documentation

## 2018-03-08 DIAGNOSIS — F17290 Nicotine dependence, other tobacco product, uncomplicated: Secondary | ICD-10-CM | POA: Insufficient documentation

## 2018-03-08 DIAGNOSIS — Z79899 Other long term (current) drug therapy: Secondary | ICD-10-CM | POA: Diagnosis not present

## 2018-03-08 DIAGNOSIS — E785 Hyperlipidemia, unspecified: Secondary | ICD-10-CM | POA: Diagnosis not present

## 2018-03-08 DIAGNOSIS — I10 Essential (primary) hypertension: Secondary | ICD-10-CM | POA: Insufficient documentation

## 2018-03-08 DIAGNOSIS — G4730 Sleep apnea, unspecified: Secondary | ICD-10-CM | POA: Insufficient documentation

## 2018-03-08 LAB — HEPATIC FUNCTION PANEL
ALT: 72 U/L — ABNORMAL HIGH (ref 0–44)
AST: 41 U/L (ref 15–41)
Albumin: 3.9 g/dL (ref 3.5–5.0)
Alkaline Phosphatase: 48 U/L (ref 38–126)
BILIRUBIN DIRECT: 0.2 mg/dL (ref 0.0–0.2)
BILIRUBIN INDIRECT: 0.5 mg/dL (ref 0.3–0.9)
BILIRUBIN TOTAL: 0.7 mg/dL (ref 0.3–1.2)
Total Protein: 7.4 g/dL (ref 6.5–8.1)

## 2018-03-08 LAB — LIPID PANEL
Cholesterol: 232 mg/dL — ABNORMAL HIGH (ref 0–200)
HDL: 50 mg/dL (ref >40)
LDL Cholesterol: 163 mg/dL — ABNORMAL HIGH (ref 0–99)
Total CHOL/HDL Ratio: 4.6 RATIO
Triglycerides: 95 mg/dL (ref <150)
VLDL: 19 mg/dL (ref 0–40)

## 2018-03-08 NOTE — Patient Instructions (Signed)
Please get a blood pressure monitor and record daily results. Call Dr.McLean with results in about 2 weeks.  Routine lab work today. Will notify you of abnormal results  Follow up in 1 year

## 2018-03-08 NOTE — Progress Notes (Signed)
PCP: Dr. Brigitte Pulse Cardiology: Dr. Aundra Dubin  43 y.o. with with history of hyperlipidemia and borderline HTN presents for cardiology followup. In 11/18, he had a calcium score scan with CAC 0.    Weight is stable compared to last year.  BP is elevated today at 140/90.  He has not been checking at home.  He notices that when he exerts himself more heavily, he feels his carotid pulsating/fullness in his neck (such as when swimming or chasing after a child). No chest pain or exertional dyspnea. Occasionally uses a Peloton, probably about once a week.  Moderate ETOH intake.  Smokes about 4 cigars/week. Continues to work full time in [place].  Married with 4 kids.   ECG (personally reviewed): NSR, normal.   PMH: 1. Borderline hypertension 2. Suspect OSA 3. Hyperlipidemia 4. Fatty liver by CT 5. Coronary calcium score scan 11/18: CAC 0 Agatston units.   Social History   Socioeconomic History  . Marital status: Married    Spouse name: Not on file  . Number of children: Not on file  . Years of education: Not on file  . Highest education level: Not on file  Occupational History  . Not on file  Social Needs  . Financial resource strain: Not on file  . Food insecurity:    Worry: Not on file    Inability: Not on file  . Transportation needs:    Medical: Not on file    Non-medical: Not on file  Tobacco Use  . Smoking status: Never Smoker  . Smokeless tobacco: Never Used  Substance and Sexual Activity  . Alcohol use: Not on file  . Drug use: Not on file  . Sexual activity: Not on file  Lifestyle  . Physical activity:    Days per week: Not on file    Minutes per session: Not on file  . Stress: Not on file  Relationships  . Social connections:    Talks on phone: Not on file    Gets together: Not on file    Attends religious service: Not on file    Active member of club or organization: Not on file    Attends meetings of clubs or organizations: Not on file    Relationship  status: Not on file  . Intimate partner violence:    Fear of current or ex partner: Not on file    Emotionally abused: Not on file    Physically abused: Not on file    Forced sexual activity: Not on file  Other Topics Concern  . Not on file  Social History Narrative  . Not on file   FH: Grandfathers with CABG in their 28s.  H/o pancreatic cancer.   ROS: All systems reviewed and negative except as per HPI.   Current Outpatient Medications  Medication Sig Dispense Refill  . albuterol (PROVENTIL HFA;VENTOLIN HFA) 108 (90 Base) MCG/ACT inhaler Inhale 1-2 puffs into the lungs every 6 (six) hours as needed for wheezing or shortness of breath.     No current facility-administered medications for this encounter.    BP 140/90   Pulse 80   Wt 123.5 kg (272 lb 4 oz)   SpO2 99%   BMI 35.92 kg/m  General: NAD, overweight.  Neck: No JVD, no thyromegaly or thyroid nodule.  Lungs: Clear to auscultation bilaterally with normal respiratory effort. CV: Nondisplaced PMI.  Heart regular S1/S2, no S3/S4, no murmur.  No peripheral edema.  No carotid bruit.  Normal pedal pulses.  Abdomen: Soft, nontender, no hepatosplenomegaly, no distention.  Skin: Intact without lesions or rashes.  Neurologic: Alert and oriented x 3.  Psych: Normal affect. Extremities: No clubbing or cyanosis.  HEENT: Normal.   Assessment/Plan: 1. HTN: BP running above goal.  I think that weight and sleep apnea are playing a role.  He has not lost any weight since last appointment a year ago.  Not exercising much.  I think that the sensation neck pulsations that he gets with exercise may be due to elevated BP.  - He is going to get a BP cuff and I will have him check BP daily at home x 2 weeks, then call the numbers in to me.   - Long discussion about increased activity and starting to cut back on caloric intake.  He is going to make a concerted effort to drop weight prior to starting a medication or going for a sleep study.  He  snores much less when his weight is lower.  2. Hyperlipidemia: LDL was high at 158 in 2018.  He does not have a family history of premature CAD.  Coronary artery calcium score was 0 in 2018 suggesting low risk of cardiac events.   - I will repeat lipids today.  If LDL is the same or higher, would recommend that he start Crestor 10 mg daily.  If lower, would continue to work on diet for now.   3. Suspect OSA: Strong suspicion for a degree of OSA.  He will work on weight loss before we order a sleep study.  4. Smoking: He smokes about 4 cigars/week.  We discussed cutting back on this.  5. Fatty liver: Noted on CT for calcium score in 2018.  This likely will improve with weight loss and cholesterol lowering. Keep ETOH intake moderate or less.   Loralie Champagne 03/08/2018

## 2018-03-15 ENCOUNTER — Telehealth (HOSPITAL_COMMUNITY): Payer: Self-pay

## 2018-03-15 ENCOUNTER — Encounter (HOSPITAL_COMMUNITY): Payer: Self-pay

## 2018-03-15 NOTE — Telephone Encounter (Signed)
-----   Message from Larey Dresser, MD sent at 03/08/2018  3:15 PM EST ----- LDL higher than prior at 163.  Slight ALT elevation.  Think he should take statin, suggest Crestor 10 mg daily with lipids/LFTs in 2 months.

## 2018-03-15 NOTE — Telephone Encounter (Signed)
I have been unable to reach this patient by phone.  A letter is being sent to the last known home address.

## 2019-10-30 ENCOUNTER — Ambulatory Visit (INDEPENDENT_AMBULATORY_CARE_PROVIDER_SITE_OTHER): Payer: 59

## 2019-10-30 ENCOUNTER — Ambulatory Visit: Payer: 59 | Admitting: Family Medicine

## 2019-10-30 ENCOUNTER — Other Ambulatory Visit: Payer: Self-pay

## 2019-10-30 ENCOUNTER — Encounter: Payer: Self-pay | Admitting: Family Medicine

## 2019-10-30 VITALS — BP 122/88 | HR 104 | Ht 73.0 in | Wt 262.0 lb

## 2019-10-30 DIAGNOSIS — G8929 Other chronic pain: Secondary | ICD-10-CM | POA: Diagnosis not present

## 2019-10-30 DIAGNOSIS — M542 Cervicalgia: Secondary | ICD-10-CM | POA: Diagnosis not present

## 2019-10-30 DIAGNOSIS — M5010 Cervical disc disorder with radiculopathy, unspecified cervical region: Secondary | ICD-10-CM

## 2019-10-30 DIAGNOSIS — M999 Biomechanical lesion, unspecified: Secondary | ICD-10-CM | POA: Diagnosis not present

## 2019-10-30 DIAGNOSIS — M25511 Pain in right shoulder: Secondary | ICD-10-CM

## 2019-10-30 MED ORDER — PREDNISONE 50 MG PO TABS: ORAL_TABLET | ORAL | 0 refills | Status: DC

## 2019-10-30 MED ORDER — GABAPENTIN 100 MG PO CAPS: 200.0000 mg | ORAL_CAPSULE | Freq: Every day | ORAL | 0 refills | Status: DC

## 2019-10-30 NOTE — Patient Instructions (Signed)
Xray today Prednisone  Gabapentin  See me in 5-6 weeks

## 2019-10-30 NOTE — Progress Notes (Signed)
[facility] [place] Phone: [phone] Subjective:   [name], am serving as a scribe for Dr. Hulan Saas. This visit occurred during the SARS-CoV-2 public health emergency.  Safety protocols were in place, including screening questions prior to the visit, additional usage of staff PPE, and extensive cleaning of exam room while observing appropriate contact time as indicated for disinfecting solutions.   I'm seeing this patient by the request  of:  Marton Redwood, MD  CC: Neck pain arm pain and weakness  [id number]  Maximilien Hayashi is a 44 y.o. male coming in with complaint of right arm weakness. Last seen in 2016. Patient states that he has 4 dogs and one of them pulled on his shoulder. Patient notices with throwing them a ball that he develops pain over superior aspect of shoulder. Patient notices trap tightness. Tried to do bench press last week and was unable to push up as good with right arm. Does have tingling down into right hand into thumb and index finger.       No past medical history on file. No past surgical history on file. Social History   Socioeconomic History  . Marital status: Married    Spouse name: Not on file  . Number of children: Not on file  . Years of education: Not on file  . Highest education level: Not on file  Occupational History  . Not on file  Tobacco Use  . Smoking status: Never Smoker  . Smokeless tobacco: Never Used  Substance and Sexual Activity  . Alcohol use: Not on file  . Drug use: Not on file  . Sexual activity: Not on file  Other Topics Concern  . Not on file  Social History Narrative  . Not on file   Social Determinants of Health   Financial Resource Strain:   . Difficulty of Paying Living Expenses: Not on file  Food Insecurity:   . Worried About Charity fundraiser in the Last Year: Not on file  . Ran Out of Food in the Last Year: Not on file   Transportation Needs:   . Lack of Transportation (Medical): Not on file  . Lack of Transportation (Non-Medical): Not on file  Physical Activity:   . Days of Exercise per Week: Not on file  . Minutes of Exercise per Session: Not on file  Stress:   . Feeling of Stress : Not on file  Social Connections:   . Frequency of Communication with Friends and Family: Not on file  . Frequency of Social Gatherings with Friends and Family: Not on file  . Attends Religious Services: Not on file  . Active Member of Clubs or Organizations: Not on file  . Attends Archivist Meetings: Not on file  . Marital Status: Not on file   Allergies  Allergen Reactions  . Penicillins    No family history on file.  Current Outpatient Medications (Endocrine & Metabolic):  .  predniSONE (DELTASONE) 50 MG tablet, Take one tablet daily for the next 5 days.   Current Outpatient Medications (Respiratory):  .  albuterol (PROVENTIL HFA;VENTOLIN HFA) 108 (90 Base) MCG/ACT inhaler, Inhale 1-2 puffs into the lungs every 6 (six) hours as needed for wheezing or shortness of breath.    Current Outpatient Medications (Other):  .  gabapentin (NEURONTIN) 100 MG capsule, Take 2 capsules (200 mg total) by mouth at bedtime.   Reviewed prior external information including notes and  imaging from  primary care provider As well as notes that were available from care everywhere and other healthcare systems.  Past medical history, social, surgical and family history all reviewed in electronic medical record.  No pertanent information unless stated regarding to the chief complaint.   Review of Systems:  No headache, visual changes, nausea, vomiting, diarrhea, constipation, dizziness, abdominal pain, skin rash, fevers, chills, night sweats, weight loss, swollen lymph nodes, body aches, joint swelling, chest pain, shortness of breath, mood changes. POSITIVE muscle aches  Objective  Blood pressure 122/88, pulse (!) 104,  height 6\' 1"  (1.854 m), weight 262 lb (118.8 kg), SpO2 98 %.   General: No apparent distress alert and oriented x3 mood and affect normal, dressed appropriately.  HEENT: Pupils equal, extraocular movements intact  Respiratory: Patient's speak in full sentences and does not appear short of breath  Cardiovascular: No lower extremity edema, non tender, no erythema  Neuro: Cranial nerves II through XII are intact, neurovascularly intact in all extremities with 2+ DTRs and 2+ pulses.  Gait normal with good balance and coordination.  MSK:  Non tender with full range of motion and good stability and symmetric strength and tone of shoulders, elbows, wrist, hip, knee and ankles bilaterally.  Neck exam shows some mild loss of lordosis, some mild positive Spurling's with radicular symptoms down in the C6 distribution.  No significant weakness over the shoulder girdle noted.  Patient lacks the last 5 degrees of sidebending and extension on the right.  Osteopathic findings C2 flexed rotated and side bent right C4 flexed rotated and side bent left C7 flexed rotated and side bent right T3 extended rotated and side bent right inhaled third rib     Impression and Recommendations:     The above documentation has been reviewed and is accurate and complete [name], DO

## 2019-10-30 NOTE — Assessment & Plan Note (Signed)
Appears to have more of a right-sided cervical radiculopathy.  Discussed with patient in great length about icing regimen, prednisone, gabapentin given.  We discussed posture and ergonomics, work with Product/process development scientist.  X-rays pending.  Follow-up again in 4 to 6 weeks.

## 2019-10-30 NOTE — Assessment & Plan Note (Signed)
   Decision today to treat with OMT was based on Physical Exam  After verbal consent patient was treated with HVLA, ME, FPR techniques in cervical, thoracic, rib,  areas,  Patient tolerated the procedure well with improvement in symptoms  Patient given exercises, stretches and lifestyle modifications  See medications in patient instructions if given  Patient will follow up in 4-8 weeks 

## 2019-11-06 ENCOUNTER — Telehealth: Payer: Self-pay | Admitting: Family Medicine

## 2019-11-06 NOTE — Telephone Encounter (Signed)
Pt calling for xray results ( I did not see that the C-spine was reviewed). He is not yet in [place], please call at [phone].

## 2019-11-06 NOTE — Telephone Encounter (Signed)
Very mild arthritis of both no change in management

## 2019-11-06 NOTE — Telephone Encounter (Signed)
Spoke with patient per results.  

## 2019-12-11 NOTE — Progress Notes (Signed)
Jeremy Warren Jeremy Warren Phone: [phone] Subjective:   Jeremy Warren, am serving as a scribe for Dr. Hulan Saas. This visit occurred during the SARS-CoV-2 public health emergency.  Safety protocols were in place, including screening questions prior to the visit, additional usage of staff PPE, and extensive cleaning of exam room while observing appropriate contact time as indicated for disinfecting solutions.  I'm seeing this patient by the request  of:  Jeremy Redwood, MD  CC: Right-sided neck pain  XBL:[id number]   10/30/2019 Appears to have more of a right-sided cervical radiculopathy.  Discussed with patient in great length about icing regimen, prednisone, gabapentin given.  We discussed posture and ergonomics, work with Product/process development scientist.  X-rays pending.  Follow-up again in 4 to 6 weeks.  Update 12/17/2019 Jeremy Warren is a 44 y.o. male coming in with complaint of back and neck pain and right shoulder pain. OMT 10/30/2019. Patient states doing significantly better at this time.  Feels the prednisone was significantly helpful.  Doing the exercises occasionally.  Still feels like there is some mild weakness of the arm.  Nothing Warren severe.          Reviewed prior external information including notes and imaging from previsou exam, outside providers and external EMR if available.   As well as notes that were available from care everywhere and other healthcare systems.  Past medical history, social, surgical and family history all reviewed in electronic medical record.  Warren pertanent information unless stated regarding to the chief complaint.   Warren past medical history on file.  Allergies  Allergen Reactions  . Penicillins      Review of Systems:  Warren headache, visual changes, nausea, vomiting, diarrhea, constipation, dizziness, abdominal pain, skin rash, fevers, chills, night sweats, weight loss, swollen lymph  nodes, body aches, joint swelling, chest pain, shortness of breath, mood changes. POSITIVE muscle aches  Objective  Blood pressure 128/84, pulse 85, height 6\' 1"  (1.854 m), weight 275 lb (124.7 kg), SpO2 97 %.   General: Warren apparent distress alert and oriented x3 mood and affect normal, dressed appropriately.  HEENT: Pupils equal, extraocular movements intact  Respiratory: Patient's speak in full sentences and does not appear short of breath  Cardiovascular: Warren lower extremity edema, non tender, Warren erythema  Neuro: Cranial nerves II through XII are intact, neurovascularly intact in all extremities with 2+ DTRs and 2+ pulses.  Gait normal with good balance and coordination.  Neck exam does have some loss of lordosis.  Patient does have negative Spurling's.  Patient has 5-5 strength of the upper extremity now.  Deep tendon reflexes do appear to be intact.  Osteopathic findings  C6 flexed rotated and side bent left T4 extended rotated and side bent right inhaled rib T7 extended rotated and side bent left L2 flexed rotated and side bent right Sacrum right on right        Assessment and Plan:  Cervical disc disorder with radiculopathy of cervical region Patient is already making improvement overall.  Responds well to osteopathic manipulation.  Discussed posture and ergonomics.  Patient can continue gabapentin at night.  Follow-up with me again in 2 to 3 months    Nonallopathic problems  Decision today to treat with OMT was based on Physical Exam  After verbal consent patient was treated with HVLA, ME, FPR techniques in cervical, rib, thoracic, lumbar, and sacral  areas  Patient tolerated the procedure well with improvement in  symptoms  Patient given exercises, stretches and lifestyle modifications  See medications in patient instructions if given  Patient will follow up in 4-8 weeks      The above documentation has been reviewed and is accurate and complete Jeremy Pulley,  DO       Note: This dictation was prepared with Dragon dictation along with smaller phrase technology. Any transcriptional errors that result from this process are unintentional.

## 2019-12-17 ENCOUNTER — Encounter: Payer: Self-pay | Admitting: Family Medicine

## 2019-12-17 ENCOUNTER — Other Ambulatory Visit: Payer: Self-pay

## 2019-12-17 ENCOUNTER — Ambulatory Visit: Payer: 59 | Admitting: Family Medicine

## 2019-12-17 VITALS — BP 128/84 | HR 85 | Ht 73.0 in | Wt 275.0 lb

## 2019-12-17 DIAGNOSIS — M5010 Cervical disc disorder with radiculopathy, unspecified cervical region: Secondary | ICD-10-CM | POA: Diagnosis not present

## 2019-12-17 DIAGNOSIS — M999 Biomechanical lesion, unspecified: Secondary | ICD-10-CM | POA: Diagnosis not present

## 2019-12-17 NOTE — Patient Instructions (Signed)
Glad you are doing better Gabapentin as needed at night Look in Asymmetric Training Continue exercises See me in 2 months

## 2019-12-17 NOTE — Assessment & Plan Note (Signed)
Patient is already making improvement overall.  Responds well to osteopathic manipulation.  Discussed posture and ergonomics.  Patient can continue gabapentin at night.  Follow-up with me again in 2 to 3 months

## 2020-02-14 ENCOUNTER — Ambulatory Visit: Payer: 59 | Admitting: Family Medicine

## 2020-12-25 ENCOUNTER — Telehealth (HOSPITAL_COMMUNITY): Payer: Self-pay | Admitting: Vascular Surgery

## 2020-12-25 NOTE — Telephone Encounter (Signed)
Left pt VM to make appt w/ [facility] week after Christmas

## 2021-01-15 ENCOUNTER — Encounter (HOSPITAL_COMMUNITY): Payer: Self-pay | Admitting: Cardiology

## 2021-01-15 ENCOUNTER — Ambulatory Visit (HOSPITAL_COMMUNITY)
Admission: RE | Admit: 2021-01-15 | Discharge: 2021-01-15 | Disposition: A | Discharge status: Home / Self Care | Payer: 59 | Source: Ambulatory Visit | Attending: Cardiology | Admitting: Cardiology

## 2021-01-15 ENCOUNTER — Other Ambulatory Visit: Payer: Self-pay

## 2021-01-15 VITALS — BP 152/78 | HR 88 | Wt 272.0 lb

## 2021-01-15 DIAGNOSIS — R0683 Snoring: Secondary | ICD-10-CM | POA: Insufficient documentation

## 2021-01-15 DIAGNOSIS — Z79899 Other long term (current) drug therapy: Secondary | ICD-10-CM | POA: Diagnosis not present

## 2021-01-15 DIAGNOSIS — E785 Hyperlipidemia, unspecified: Secondary | ICD-10-CM | POA: Diagnosis not present

## 2021-01-15 DIAGNOSIS — K760 Fatty (change of) liver, not elsewhere classified: Secondary | ICD-10-CM | POA: Diagnosis not present

## 2021-01-15 DIAGNOSIS — Z7901 Long term (current) use of anticoagulants: Secondary | ICD-10-CM | POA: Diagnosis not present

## 2021-01-15 DIAGNOSIS — E663 Overweight: Secondary | ICD-10-CM | POA: Diagnosis not present

## 2021-01-15 DIAGNOSIS — Z6835 Body mass index (BMI) 35.0-35.9, adult: Secondary | ICD-10-CM | POA: Insufficient documentation

## 2021-01-15 DIAGNOSIS — I509 Heart failure, unspecified: Secondary | ICD-10-CM | POA: Insufficient documentation

## 2021-01-15 DIAGNOSIS — I10 Essential (primary) hypertension: Secondary | ICD-10-CM | POA: Insufficient documentation

## 2021-01-15 DIAGNOSIS — F17210 Nicotine dependence, cigarettes, uncomplicated: Secondary | ICD-10-CM | POA: Diagnosis not present

## 2021-01-15 LAB — LIPID PANEL
Cholesterol: 226 mg/dL — ABNORMAL HIGH (ref 0–200)
HDL: 49 mg/dL (ref >40)
LDL Cholesterol: 143 mg/dL — ABNORMAL HIGH (ref 0–99)
Total CHOL/HDL Ratio: 4.6 RATIO
Triglycerides: 168 mg/dL — ABNORMAL HIGH (ref <150)
VLDL: 34 mg/dL (ref 0–40)

## 2021-01-15 LAB — BASIC METABOLIC PANEL
Anion gap: 9 (ref 5–15)
BUN: 13 mg/dL (ref 6–20)
CO2: 28 mmol/L (ref 22–32)
Calcium: 9.6 mg/dL (ref 8.9–10.3)
Chloride: 103 mmol/L (ref 98–111)
Creatinine, Ser: 1.02 mg/dL (ref 0.61–1.24)
GFR, Estimated: >60 mL/min (ref >60)
Glucose, Bld: 118 mg/dL — ABNORMAL HIGH (ref 70–99)
Potassium: 4 mmol/L (ref 3.5–5.1)
Sodium: 140 mmol/L (ref 135–145)

## 2021-01-15 LAB — HEMOGLOBIN A1C
Hgb A1c MFr Bld: 5.5 % (ref 4.8–5.6)
Mean Plasma Glucose: 111.15 mg/dL

## 2021-01-15 MED ORDER — BISOPROLOL FUMARATE 10 MG PO TABS: 10.0000 mg | ORAL_TABLET | Freq: Every day | ORAL | 11 refills | Status: DC

## 2021-01-15 NOTE — Progress Notes (Signed)
Height:     Weight: BMI:  Today's Date:  STOP BANG RISK ASSESSMENT S (snore) Have you been told that you snore?     YES   T (tired) Are you often tired, fatigued, or sleepy during the day?   YES  O (obstruction) Do you stop breathing, choke, or gasp during sleep? NO   P (pressure) Do you have or are you being treated for high blood pressure? YES   B (BMI) Is your body index greater than 35 kg/m? NO   A (age) Are you 43 years old or older? NO   N (neck) Do you have a neck circumference greater than 16 inches?   NO   G (gender) Are you a male? YES   TOTAL STOP/BANG YES ANSWERS 4                                                                       For Office Use Only              Procedure Order Form    YES to 3+ Stop Bang questions OR two clinical symptoms - patient qualifies for WatchPAT (CPT 83254)      Clinical Notes: Will consult Sleep Specialist and refer for management of therapy due to patient increased risk of Sleep Apnea. Ordering a sleep study due to the following two clinical symptoms: Excessive daytime sleepiness G47.10 /  Nocturia R35.1 / Morning Headaches G44.221 / Difficulty concentrating R41.840 / Loud snoring R06.83 / Unrefreshed by sleep G47.8 / History of high blood pressure R03.0 / Insomnia G47.00

## 2021-01-15 NOTE — Patient Instructions (Addendum)
Medication Changes:  Start Bisoprolol 10mg  daily  Lab Work:  Labs done today, your results will be available in MyChart, we will contact you for abnormal readings.   Testing/Procedure  Your physician has recommended that you have a sleep study. This test records several body functions during sleep, including: brain activity, eye movement, oxygen and carbon dioxide blood levels, heart rate and rhythm, breathing rate and rhythm, the flow of air through your mouth and nose, snoring, body muscle movements, and chest and belly movement.   Referrals:  You have been referred to [facility]   Special Instructions // Education:  Your provider has recommended that you have a home sleep study.  We have provided you with the equipment in our office today. Please download the app and follow the instructions. YOUR PIN NUMBER IS: [id number]. Once you have completed the test you just dispose of the equipment, the information is automatically uploaded to [place] via blue-tooth technology. If your test is positive for sleep apnea and you need a home CPAP machine you will be contacted by Dr Theodosia Blender office [facility]) to set this up.    Follow-Up in: 1 year (December 2023) ** Call in November 2023 for follow up**  At the [facility], you and your health needs are our priority. We have a designated team specialized in the treatment of Heart Failure. This Care Team includes your primary Heart Failure Specialized Cardiologist (physician), Advanced Practice Providers (APPs- Physician Assistants and Nurse Practitioners), and Pharmacist who all work together to provide you with the care you need, when you need it.   You may see any of the following providers on your designated Care Team at your next follow up:  Dr Glori Bickers Dr Haynes Kerns, NP Lyda Jester, [place] [facility] [place], [place] Audry Riles, PharmD   Please be sure to bring in all  your medications bottles to every appointment.   Need to Contact us:  If you have any questions or concerns before your next appointment please send [place] a message through [place] or call our office at [phone].    TO LEAVE A MESSAGE FOR THE NURSE SELECT OPTION 2, PLEASE LEAVE A MESSAGE INCLUDING: YOUR NAME DATE OF BIRTH CALL BACK NUMBER REASON FOR CALL**this is important as we prioritize the call backs  YOU WILL RECEIVE A CALL BACK THE SAME DAY AS LONG AS YOU CALL BEFORE 4:00 PM

## 2021-01-15 NOTE — Progress Notes (Signed)
Patient given home sleep device and instructions for use during clinic appt.  He downloaded the App and will await phone call regarding Insurance precertification in order to proceed.

## 2021-01-15 NOTE — Progress Notes (Signed)
PCP: Dr. Brigitte Pulse Cardiology: Dr. Aundra Dubin  45 y.o. with with history of hyperlipidemia and elevated BP presents for cardiology followup. In 11/18, he had a calcium score scan with CAC 0.    BP is up today, 152/78.  He feels like it has been running high at home, notes "pounding" in neck when he is lying in bed.  No exertional dyspnea or chest pain.  No palpitations/lightheadedness.  He does note snoring and daytime sleepiness.  Moderate ETOH intake.  Smokes about 4 cigars/week. Continues to work full time in [place].  Married with 4 kids.   ECG (personally reviewed): NSR, normal  Labs (2/20): LDL 163  PMH: 1. Hypertension 2. Suspect OSA 3. Hyperlipidemia 4. Fatty liver by CT 5. Coronary calcium score scan 11/18: CAC 0 Agatston units.   Social History   Socioeconomic History   Marital status: Married    Spouse name: Not on file   Number of children: Not on file   Years of education: Not on file   Highest education level: Not on file  Occupational History   Not on file  Tobacco Use   Smoking status: Never   Smokeless tobacco: Never  Substance and Sexual Activity   Alcohol use: Not on file   Drug use: Not on file   Sexual activity: Not on file  Other Topics Concern   Not on file  Social History Narrative   Not on file   Social Determinants of Health   Financial Resource Strain: Not on file  Food Insecurity: Not on file  Transportation Needs: Not on file  Physical Activity: Not on file  Stress: Not on file  Social Connections: Not on file  Intimate Partner Violence: Not on file   FH: Grandfathers with CABG in their 76s.  H/o pancreatic cancer.   ROS: All systems reviewed and negative except as per HPI.   Current Outpatient Medications  Medication Sig Dispense Refill   albuterol (PROVENTIL HFA;VENTOLIN HFA) 108 (90 Base) MCG/ACT inhaler Inhale 1-2 puffs into the lungs every 6 (six) hours as needed for wheezing or shortness of breath.     bisoprolol  (ZEBETA) 10 MG tablet Take 1 tablet (10 mg total) by mouth daily. 30 tablet 11   tadalafil (CIALIS) 20 MG tablet 1/2-1 pill daily as needed     No current facility-administered medications for this encounter.   BP (!) 152/78    Pulse 88    Wt 123.4 kg (272 lb)    SpO2 98%    BMI 35.89 kg/m  General: NAD, overweight Neck: No JVD, no thyromegaly or thyroid nodule.  Lungs: Clear to auscultation bilaterally with normal respiratory effort. CV: Nondisplaced PMI.  Heart regular S1/S2, no S3/S4, no murmur.  No peripheral edema.  No carotid bruit.  Normal pedal pulses.  Abdomen: Soft, nontender, no hepatosplenomegaly, no distention.  Skin: Intact without lesions or rashes.  Neurologic: Alert and oriented x 3.  Psych: Normal affect. Extremities: No clubbing or cyanosis.  HEENT: Normal.   Assessment/Plan: 1. HTN: BP is high.  He has not had much success in weight loss, no change over the last 2 years.  I think that he is going to need treatment of HTN at this point.  - Start lisinopril 10 mg daily.  BMET today and in 10 days.   - He will get a BP cuff and check BP daily for the next 2 wks on lisinopril, call in readings.  - Needs weight loss.  - Probably  needs treatment of OSA.  2. Hyperlipidemia: LDL was high at 158 in 2018 and 163 in 2020.  He does not have a family history of premature CAD.  Coronary artery calcium score was 0 in 2018 suggesting low risk at that time of cardiac events.   - I will repeat lipids today.  If LDL is in a similar range, would recommend that he start Crestor 10 mg daily with lipids/LFTs in 2 months.  3. Suspect OSA: Strong suspicion for a degree of OSA.  He has not had much success with weight loss.   - I will arrange for home sleep study.   4. Smoking: He smokes about 4 cigars/week.  We discussed cutting back on this.  5. Fatty liver: Noted on CT for calcium score in 2018.  This likely will improve with weight loss and cholesterol lowering. Keep ETOH intake moderate  or less.  6. Overweight: Not much success with weight loss over the last 2 years.  - I will refer to pharmacy clinic for semaglutide.  - Increase activity/work on diet.  - Check hgbA1c.   Followup 3 wks with pharmacist for BP check and med adjustment.  See me in 1 year.   Loralie Champagne 01/15/2021

## 2021-01-16 ENCOUNTER — Telehealth (HOSPITAL_COMMUNITY): Payer: Self-pay

## 2021-01-16 MED ORDER — ROSUVASTATIN CALCIUM 10 MG PO TABS: 10.0000 mg | ORAL_TABLET | Freq: Every day | ORAL | 3 refills | Status: DC

## 2021-01-16 NOTE — Telephone Encounter (Addendum)
Pt aware, agreeable, and verbalized understanding  Script sent to pharmacy  ----- Message from Larey Dresser, MD sent at 01/15/2021  5:34 PM EST ----- Fasting glucose is slightly elevated but hemoglobin A1c is NOT elevated.  Think ok.  LDL high, would recommend Crestor 10 mg daily with lipids/LFTs in 2 months.

## 2021-01-21 ENCOUNTER — Telehealth: Payer: Self-pay | Admitting: Pharmacist

## 2021-01-21 MED ORDER — OZEMPIC (0.25 OR 0.5 MG/DOSE) 2 MG/1.5ML ~~LOC~~ SOPN: PEN_INJECTOR | SUBCUTANEOUS | 1 refills | Status: DC

## 2021-01-21 NOTE — Telephone Encounter (Addendum)
[place] prior auth denied, his plan excludes coverage for weight loss medications.   Prior authorization submitted for Ozempic instead. This has also been denied since pt does not have DM.  Called pt and advised him that his insurance does not cover any medications for weight loss (and cannot use GLP off label because his plan requires prior authorization that is denying coverage due to off label use). He is aware and was appreciate that we looked into coverage.

## 2021-01-21 NOTE — Telephone Encounter (Signed)
Pt referred by Dr Aundra Dubin to start [facility] for weight loss. Prior authorization has been submitted for [facility]. Will follow up with pt once coverage determination has been made.

## 2021-01-27 ENCOUNTER — Other Ambulatory Visit (HOSPITAL_COMMUNITY): Payer: 59

## 2021-02-02 ENCOUNTER — Telehealth (HOSPITAL_COMMUNITY): Payer: Self-pay | Admitting: Surgery

## 2021-02-02 NOTE — Telephone Encounter (Signed)
I called to inform patient that he can complete the ordered home sleep study as we confirmed that insurance pre cert is not necessary.  He denies any questions and will proceed.

## 2021-02-20 ENCOUNTER — Telehealth (HOSPITAL_COMMUNITY): Payer: Self-pay | Admitting: Surgery

## 2021-02-20 NOTE — Telephone Encounter (Signed)
Patient called and message left reminding him to complete the ordered home sleep study.

## 2021-12-31 IMAGING — DX DG CERVICAL SPINE 2 OR 3 VIEWS
4 series · 4 of 4 positions shown · non-contrast
Comparison: None.

CLINICAL DATA: Cervical neck pain. Right-sided neck and shoulder
pain.

EXAM:
CERVICAL SPINE - 2-3 VIEW

[c-spine lat]
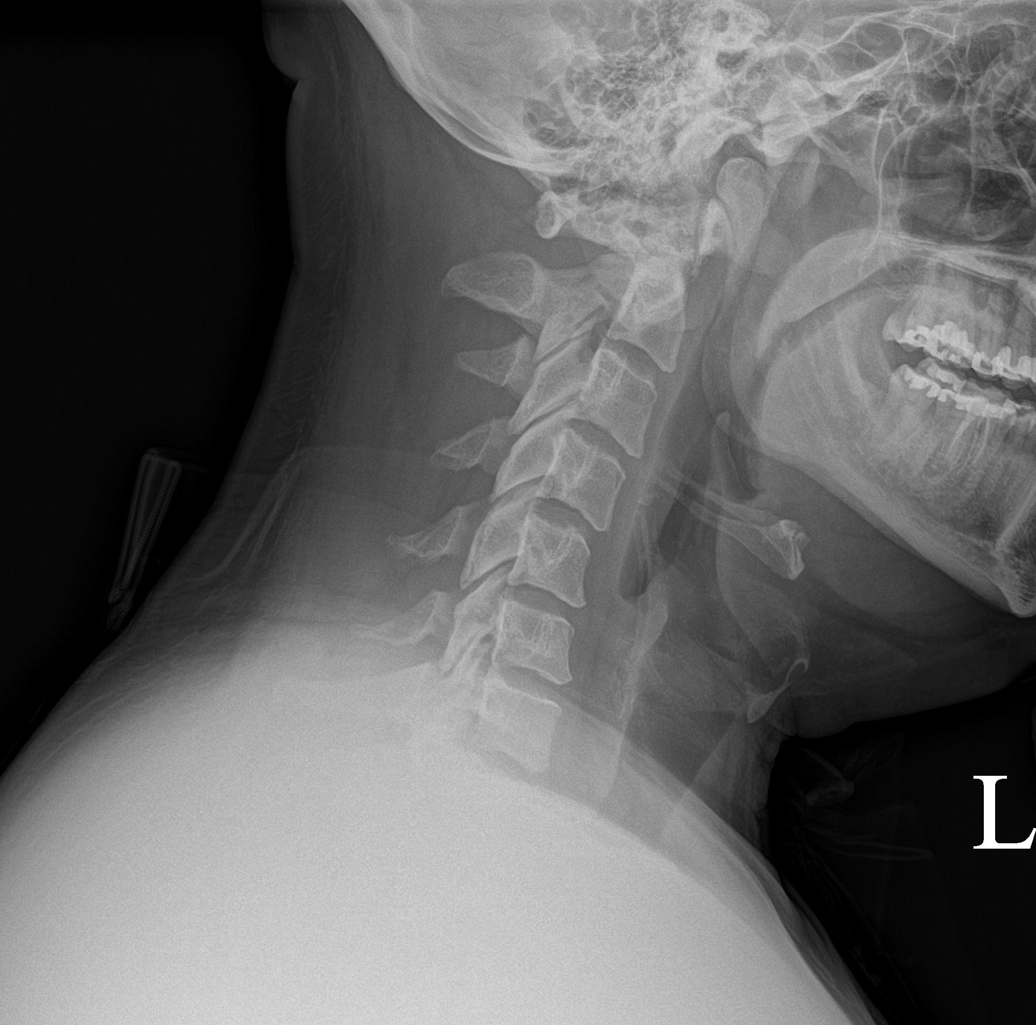

[c-spine ap]
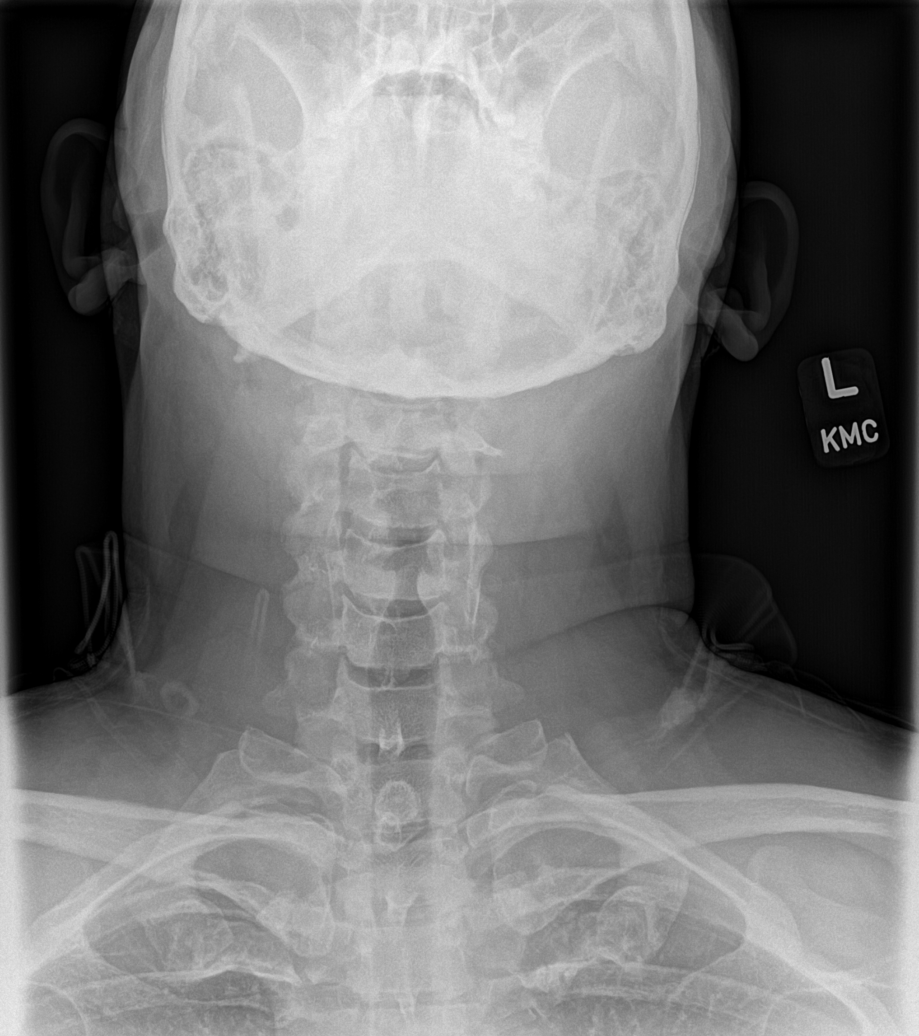

[ct-spine swimmers]
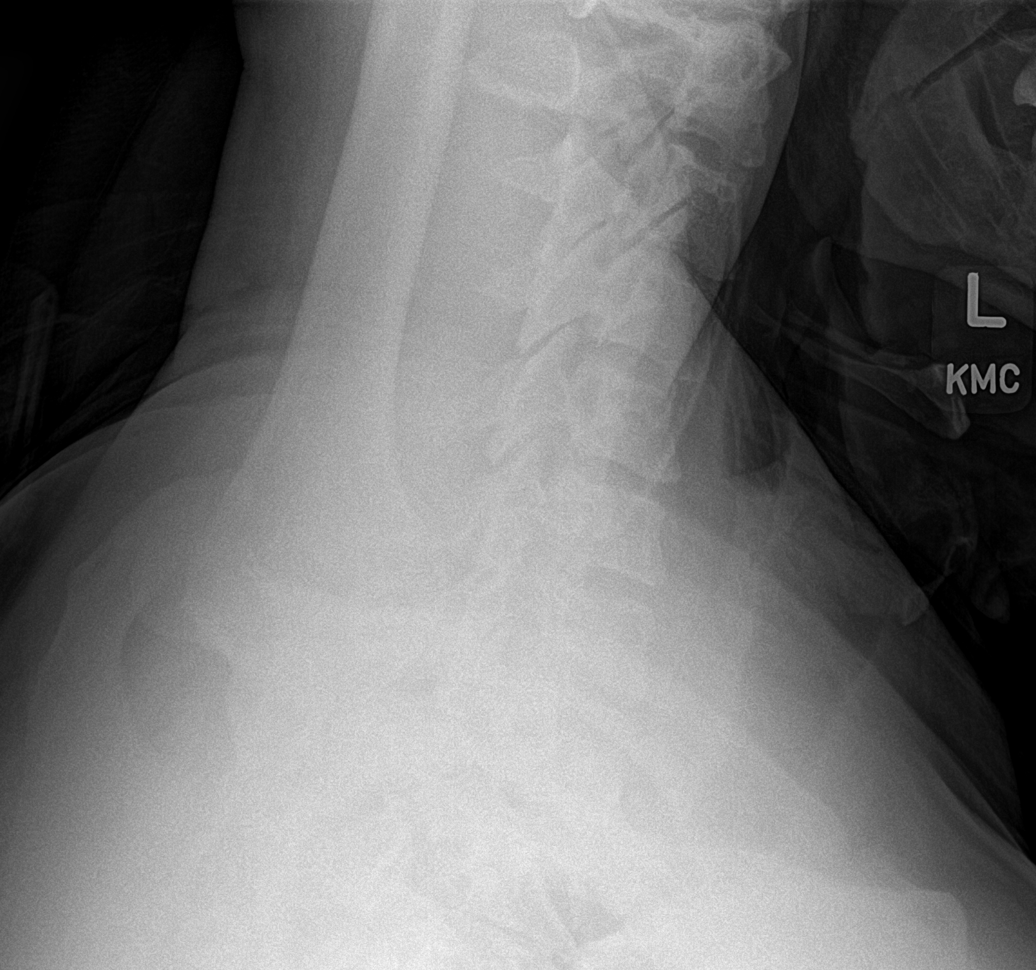

[c-spine open mouth]
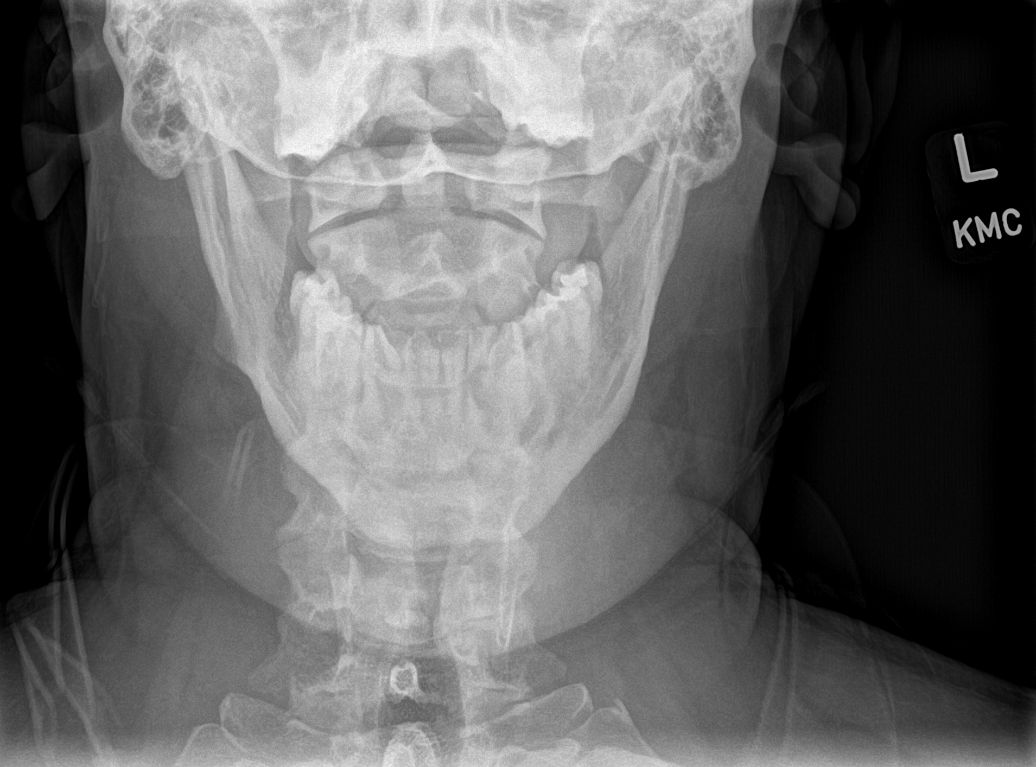

[4 of 4 positions shown; findings below may reference images not displayed]

FINDINGS: Straightening and slight reversal of normal lordosis. No listhesis.
Vertebral body heights are normal. Mild endplate spurring at
multiple levels. Minimal disc space narrowing at C3-C4. Remaining
disc spaces are preserved. Lateral masses of C1 are well aligned on
C2. No evidence of fracture or focal bone lesion. No prevertebral
soft tissue edema.
IMPRESSION: Mild degenerative disc disease at C3-C4. Slight reversal of normal
lordosis may be due to muscle spasm or positioning.

## 2022-01-15 ENCOUNTER — Other Ambulatory Visit (HOSPITAL_COMMUNITY): Payer: Self-pay

## 2022-01-15 MED ORDER — ROSUVASTATIN CALCIUM 10 MG PO TABS: 10.0000 mg | ORAL_TABLET | Freq: Every day | ORAL | 2 refills | Status: DC

## 2022-01-15 MED ORDER — BISOPROLOL FUMARATE 10 MG PO TABS: 10.0000 mg | ORAL_TABLET | Freq: Every day | ORAL | 2 refills | Status: DC

## 2022-04-19 ENCOUNTER — Other Ambulatory Visit (HOSPITAL_COMMUNITY): Payer: Self-pay | Admitting: Cardiology

## 2022-12-21 ENCOUNTER — Encounter: Payer: 59 | Admitting: Gastroenterology

## 2023-04-07 ENCOUNTER — Encounter: Payer: Self-pay | Admitting: Gastroenterology

## 2023-05-10 ENCOUNTER — Ambulatory Visit (AMBULATORY_SURGERY_CENTER): Payer: Self-pay

## 2023-05-10 VITALS — Ht 73.0 in | Wt 274.0 lb

## 2023-05-10 DIAGNOSIS — Z1211 Encounter for screening for malignant neoplasm of colon: Secondary | ICD-10-CM

## 2023-05-10 NOTE — Progress Notes (Signed)
 No egg or soy allergy known to patient  No issues known to pt with past sedation with any surgeries or procedures Patient denies ever being told they had issues or difficulty with intubation  No FH of Malignant Hyperthermia Pt is not on diet pills Pt is not on  home 02  Pt is not on blood thinners  Pt denies issues with constipation  No A fib or A flutter Have any cardiac testing pending--no  LOA: independent Prep: spilt dose miralax   Patient's chart reviewed by Jeremy Warren CNRA prior to previsit and patient appropriate for the [facility].  Previsit completed and red dot placed by patient's name on their procedure day (on provider's schedule).     PV completed with patient. Prep instructions sent via mychart and home address.

## 2023-05-16 ENCOUNTER — Encounter: Payer: Self-pay | Admitting: Gastroenterology

## 2023-05-24 ENCOUNTER — Encounter: Payer: Self-pay | Admitting: Gastroenterology

## 2023-05-24 ENCOUNTER — Ambulatory Visit: Payer: Self-pay | Admitting: Gastroenterology

## 2023-05-24 VITALS — BP 112/71 | HR 76 | Temp 98.4°F | Resp 19 | Ht 73.0 in | Wt 274.0 lb

## 2023-05-24 DIAGNOSIS — K5730 Diverticulosis of large intestine without perforation or abscess without bleeding: Secondary | ICD-10-CM | POA: Diagnosis not present

## 2023-05-24 DIAGNOSIS — D125 Benign neoplasm of sigmoid colon: Secondary | ICD-10-CM | POA: Diagnosis not present

## 2023-05-24 DIAGNOSIS — Z1211 Encounter for screening for malignant neoplasm of colon: Secondary | ICD-10-CM

## 2023-05-24 DIAGNOSIS — K648 Other hemorrhoids: Secondary | ICD-10-CM | POA: Diagnosis not present

## 2023-05-24 DIAGNOSIS — K644 Residual hemorrhoidal skin tags: Secondary | ICD-10-CM | POA: Diagnosis not present

## 2023-05-24 MED ORDER — METRONIDAZOLE 500 MG PO TABS: 500.0000 mg | ORAL_TABLET | Freq: Three times a day (TID) | ORAL | 0 refills | Status: AC

## 2023-05-24 MED ORDER — CIPROFLOXACIN HCL 500 MG PO TABS: 500.0000 mg | ORAL_TABLET | Freq: Two times a day (BID) | ORAL | 0 refills | Status: AC

## 2023-05-24 MED ORDER — SODIUM CHLORIDE 0.9 % IV SOLN: 500.0000 mL | Freq: Once | INTRAVENOUS | Status: DC

## 2023-05-24 NOTE — Patient Instructions (Signed)
 Please read handouts provided. Continue present medications. Await pathology results. Repeat colonoscopy in 5 years for screening. Ciprofloxacin 500 mg twice daily for 10 days. Flagyl 500 mg tree times daily for 10 days. CT abdomen and pelvis with contrast in 2 weeks.   YOU HAD AN ENDOSCOPIC PROCEDURE TODAY AT THE [facility]:   Refer to the procedure report that was given to you for any specific questions about what was found during the examination.  If the procedure report does not answer your questions, please call your gastroenterologist to clarify.  If you requested that your care partner not be given the details of your procedure findings, then the procedure report has been included in a sealed envelope for you to review at your convenience later.  YOU SHOULD EXPECT: Some feelings of bloating in the abdomen. Passage of more gas than usual.  Walking can help get rid of the air that was put into your GI tract during the procedure and reduce the bloating. If you had a lower endoscopy (such as a colonoscopy or flexible sigmoidoscopy) you may notice spotting of blood in your stool or on the toilet paper. If you underwent a bowel prep for your procedure, you may not have a normal bowel movement for a few days.  Please Note:  You might notice some irritation and congestion in your nose or some drainage.  This is from the oxygen used during your procedure.  There is no need for concern and it should clear up in a day or so.  SYMPTOMS TO REPORT IMMEDIATELY:  Following lower endoscopy (colonoscopy or flexible sigmoidoscopy):  Excessive amounts of blood in the stool  Significant tenderness or worsening of abdominal pains  Swelling of the abdomen that is new, acute  Fever of 100F or higher.  For urgent or emergent issues, a gastroenterologist can be reached at any hour by calling [phone]. Do not use MyChart messaging for urgent concerns.    DIET:  We do recommend a small  meal at first, but then you may proceed to your regular diet.  Drink plenty of fluids but you should avoid alcoholic beverages for 24 hours.  ACTIVITY:  You should plan to take it easy for the rest of today and you should NOT DRIVE or use heavy machinery until tomorrow (because of the sedation medicines used during the test).    FOLLOW UP: Our staff will call the number listed on your records the next business day following your procedure.  We will call around 7:15- 8:00 am to check on you and address any questions or concerns that you may have regarding the information given to you following your procedure. If we do not reach you, we will leave a message.     If any biopsies were taken you will be contacted by phone or by letter within the next 1-3 weeks.  Please call us  at [phone] if you have not heard about the biopsies in 3 weeks.    SIGNATURES/CONFIDENTIALITY: You and/or your care partner have signed paperwork which will be entered into your electronic medical record.  These signatures attest to the fact that that the information above on your After Visit Summary has been reviewed and is understood.  Full responsibility of the confidentiality of this discharge information lies with you and/or your care-partner.

## 2023-05-24 NOTE — Progress Notes (Signed)
 Called to room to assist during endoscopic procedure.  Patient ID and intended procedure confirmed with present staff. Received instructions for my participation in the procedure from the performing physician.

## 2023-05-24 NOTE — Progress Notes (Unsigned)
 [facility] History and Physical   Primary Care Physician:  Raymona Caldwell, MD   Reason for Procedure:  Colorectal cancer screening  Plan:    Screening colonoscopy with possible interventions as needed     HPI: Jeremy Warren is a very pleasant 48 y.o. male here for screening colonoscopy. Denies any nausea, vomiting, abdominal pain, melena or bright red blood per rectum  The risks and benefits as well as alternatives of endoscopic procedure(s) have been discussed and reviewed. All questions answered. The patient agrees to proceed.    Past Medical History:  Diagnosis Date   Sleep apnea     History reviewed. No pertinent surgical history.  Prior to Admission medications   Medication Sig Start Date End Date Taking? Authorizing Provider  albuterol (PROVENTIL HFA;VENTOLIN HFA) 108 (90 Base) MCG/ACT inhaler Inhale 1-2 puffs into the lungs every 6 (six) hours as needed for wheezing or shortness of breath.    [provider]  bisoprolol  (ZEBETA ) 10 MG tablet TAKE 1 TABLET(10 MG) BY MOUTH DAILY Patient not taking: Reported on 05/24/2023 04/19/22   Darlis Eisenmenger, MD  fluticasone [facility]) 50 MCG/ACT nasal spray Instill 2 puffs each nostril every night. Patient not taking: Reported on 05/24/2023 08/05/21   [provider]  lisinopril (ZESTRIL) 10 MG tablet Take 10 mg by mouth daily. Patient not taking: Reported on 05/24/2023 01/18/23   [provider]  rosuvastatin  (CRESTOR ) 10 MG tablet TAKE 1 TABLET(10 MG) BY MOUTH DAILY Patient not taking: Reported on 05/24/2023 04/19/22   Darlis Eisenmenger, MD  tadalafil (CIALIS) 20 MG tablet 1/2-1 pill daily as needed Patient not taking: Reported on 05/24/2023    [provider]    Current Outpatient Medications  Medication Sig Dispense Refill   albuterol (PROVENTIL HFA;VENTOLIN HFA) 108 (90 Base) MCG/ACT inhaler Inhale 1-2 puffs into the lungs every 6 (six) hours as needed for wheezing or shortness of  breath.     bisoprolol  (ZEBETA ) 10 MG tablet TAKE 1 TABLET(10 MG) BY MOUTH DAILY (Patient not taking: Reported on 05/24/2023) 30 tablet 0   fluticasone (FLONASE) 50 MCG/ACT nasal spray Instill 2 puffs each nostril every night. (Patient not taking: Reported on 05/24/2023)     lisinopril (ZESTRIL) 10 MG tablet Take 10 mg by mouth daily. (Patient not taking: Reported on 05/24/2023)     rosuvastatin  (CRESTOR ) 10 MG tablet TAKE 1 TABLET(10 MG) BY MOUTH DAILY (Patient not taking: Reported on 05/24/2023) 30 tablet 0   tadalafil (CIALIS) 20 MG tablet 1/2-1 pill daily as needed (Patient not taking: Reported on 05/24/2023)     Current Facility-Administered Medications  Medication Dose Route Frequency Provider Last Rate Last Admin   0.9 %  sodium chloride infusion  500 mL Intravenous Once Mathhew Buysse V, MD        Allergies as of 05/24/2023 - Review Complete 05/24/2023  Allergen Reaction Noted   Penicillins Other (See Comments) 10/29/2016    Family History  Problem Relation Age of Onset   Colon cancer Neg Hx    Rectal cancer Neg Hx    Stomach cancer Neg Hx    Colon polyps Neg Hx    Esophageal cancer Neg Hx     Social History   Socioeconomic History   Marital status: Married    Spouse name: Not on file   Number of children: Not on file   Years of education: Not on file   Highest education level: Not on file  Occupational History   Not on file  Tobacco Use   Smoking status: Never   Smokeless tobacco: Never  Vaping Use   Vaping status: Never Used  Substance and Sexual Activity   Alcohol use: Yes    Alcohol/week: 3.0 standard drinks of alcohol    Types: 3 Standard drinks or equivalent per week   Drug use: Not on file   Sexual activity: Not on file  Other Topics Concern   Not on file  Social History Narrative   Not on file   Social Drivers of Health   Financial Resource Strain: Not on file  Food Insecurity: Not on file  Transportation Needs: Not on file  Physical Activity: Not on  file  Stress: Not on file  Social Connections: Unknown (06/02/2021)   Received from [facility], [facility]   Social Network    Social Network: Not on file  Intimate Partner Violence: Unknown (04/24/2021)   Received from [facility], [facility]   HITS    Physically Hurt: Not on file    Insult or Talk Down To: Not on file    Threaten Physical Harm: Not on file    Scream or Curse: Not on file    Review of Systems:  All other review of systems negative except as mentioned in the HPI.  Physical Exam: Vital signs in last 24 hours: BP (!) 139/91   Pulse 85   Temp 98.4 F (36.9 C)   Resp 15   Ht 6\' 1"  (1.854 m)   Wt 274 lb (124.3 kg)   SpO2 98%   BMI 36.15 kg/m  General:   Alert, NAD Lungs:  Clear .   Heart:  Regular rate and rhythm Abdomen:  Soft, nontender and nondistended. Neuro/Psych:  Alert and cooperative. Normal mood and affect. A and O x 3  Reviewed labs, radiology imaging, old records and pertinent past GI work up  Patient is appropriate for planned procedure(s) and anesthesia in an ambulatory setting   K. Veena Merric Yost , MD [phone]

## 2023-05-24 NOTE — Progress Notes (Unsigned)
 Report to PACU, RN, vss, BBS= Clear.

## 2023-05-24 NOTE — Op Note (Addendum)
 [facility] Patient Name: Jeremy Warren Procedure Date: 05/24/2023 8:40 AM MRN: [id number] Endoscopist: Sergio Dandy , MD, [id number] Age: 48 Referring MD:  Date of Birth: 1975-07-11 Gender: Male Account #: [id number] Procedure:                Colonoscopy Indications:              Screening for colorectal malignant neoplasm Medicines:                Monitored Anesthesia Care Procedure:                Pre-Anesthesia Assessment:                           - Prior to the procedure, a History and Physical                            was performed, and patient medications and                            allergies were reviewed. The patient's tolerance of                            previous anesthesia was also reviewed. The risks                            and benefits of the procedure and the sedation                            options and risks were discussed with the patient.                            All questions were answered, and informed consent                            was obtained. Prior Anticoagulants: The patient has                            taken no anticoagulant or antiplatelet agents. ASA                            Grade Assessment: II - A patient with mild systemic                            disease. After reviewing the risks and benefits,                            the patient was deemed in satisfactory condition to                            undergo the procedure.                           After obtaining informed consent, the colonoscope  was passed under direct vision. Throughout the                            procedure, the patient's blood pressure, pulse, and                            oxygen saturations were monitored continuously. The                            [id number]-HQ190L Colonoscope [id number] was introduced                            through the anus and advanced to the the cecum,                            identified by  appendiceal orifice and ileocecal                            valve. The colonoscopy was performed without                            difficulty. The patient tolerated the procedure                            well. The quality of the bowel preparation was                            good. The ileocecal valve, appendiceal orifice, and                            rectum were photographed. Scope In: 8:47:39 AM Scope Out: 9:06:51 AM Scope Withdrawal Time: 0 hours 16 minutes 49 seconds  Total Procedure Duration: 0 hours 19 minutes 12 seconds  Findings:                 The perianal and digital rectal examinations were                            normal.                           A 5 mm polyp was found in the sigmoid colon. The                            polyp was sessile. The polyp was removed with a                            cold snare. Resection and retrieval were complete.                           Scattered small-mouthed diverticula were found in                            the sigmoid colon and descending colon. Purulent  discharge was seen in association with the                            diverticular opening, suspicious of diverticulitis.                           Non-bleeding external and internal hemorrhoids were                            found during retroflexion. The hemorrhoids were                            medium-sized. Complications:            No immediate complications. Estimated Blood Loss:     Estimated blood loss was minimal. Impression:               - One 5 mm polyp in the sigmoid colon, removed with                            a cold snare. Resected and retrieved.                           - Diverticulosis in the sigmoid colon and in the                            descending colon. Purulent discharge was seen in                            association with the diverticular opening,                            suspicious of diverticulitis.                            - Non-bleeding external and internal hemorrhoids. Recommendation:           - Patient has a contact number available for                            emergencies. The signs and symptoms of potential                            delayed complications were discussed with the                            patient. Return to normal activities tomorrow.                            Written discharge instructions were provided to the                            patient.                           - Resume previous diet.                           -  Continue present medications.                           - Await pathology results.                           - Repeat colonoscopy in 5 years for surveillance                            based on pathology results.                           - Ciprofloxacin 500mg  twice daily X 10 days &                            Flagyl 500mg  three times daily X 10 days                           - CT abd & pelvis with contrast in 2 weeks to                            document resolution of acute diverticulitis Sergio Dandy, MD 05/24/2023 9:17:22 AM This report has been signed electronically.

## 2023-05-24 NOTE — Progress Notes (Unsigned)
 Pt's states no medical or surgical changes since previsit or office visit.

## 2023-05-25 ENCOUNTER — Telehealth: Payer: Self-pay

## 2023-05-25 NOTE — Telephone Encounter (Signed)
  Follow up Call-     05/24/2023    7:28 AM  Call back number  Post procedure Call Back phone  # [phone]  Permission to leave phone message Yes     Patient questions:  Do you have a fever, pain , or abdominal swelling? No. Pain Score  0 *  Have you tolerated food without any problems? Yes.    Have you been able to return to your normal activities? Yes.    Do you have any questions about your discharge instructions: Diet   No. Medications  No. Follow up visit  No.  Do you have questions or concerns about your Care? No.  Actions: * If pain score is 4 or above: No action needed, pain <4.

## 2023-05-26 ENCOUNTER — Other Ambulatory Visit: Payer: Self-pay | Admitting: *Deleted

## 2023-05-26 DIAGNOSIS — K5732 Diverticulitis of large intestine without perforation or abscess without bleeding: Secondary | ICD-10-CM

## 2023-05-26 LAB — SURGICAL PATHOLOGY

## 2023-05-27 ENCOUNTER — Telehealth: Payer: Self-pay | Admitting: *Deleted

## 2023-05-27 NOTE — Telephone Encounter (Signed)
 Dr Leonia Raman, You had put on this  patients colonoscopy report to schedule CT in 2 weeks. See below:  He said he wanted to talk with you first

## 2023-05-27 NOTE — Telephone Encounter (Signed)
 Called him back, agrees with the plan to proceed with CT abdomen pelvis to document resolution of sigmoid diverticulitis to exclude smoldering diverticulitis.  He is feeling overall good, no abdominal pain and he is completing the course of antibiotics

## 2023-05-27 NOTE — Telephone Encounter (Signed)
=  View-only below this line=== ----- Message ----- From: Deborah Falling Sent: 05/27/2023  10:55 AM EDT To: April H Pait; Chrisandra Counts, CMA Subject: RE: CT Abdomen Pelvis                          Pt want to talk to MD first ----- Message ----- From: Shaune Delaine, April H Sent: 05/26/2023   3:25 PM EDT To: Deborah Falling Subject: FW: CT Abdomen Pelvis                           ----- Message ----- From: Chrisandra Counts, CMA Sent: 05/26/2023   3:22 PM EDT To: April H Pait; Lavona Pounds Subject: CT Abdomen Pelvis                              Can you schedule a CT Abdomen/Pelvis for two weeks away.   I forgot to put in the order notes its to document resolution of acute diverticulitis   Thanks

## 2023-06-02 NOTE — Telephone Encounter (Signed)
 CT scheduled for [facility] Monday May 19th at 2:45 arrival to drink contrast  Radiology phone number [phone]

## 2023-06-03 NOTE — Telephone Encounter (Signed)
 I Called radiology and cancelled CT Scan for the patient. I left a message  on his machine radiology's phone number in case he wanted to reschedule previously.  Dr Leonia Raman contacted the patient and he agreed to have the CT Scan but now it seems he does not want it .  I gave him radiology's number again to call them at his convenience when he is ready to reschedule his CT Scan. Voicemail left

## 2023-06-03 NOTE — Telephone Encounter (Signed)
 Patient called and stated that he is very upset that he was just scheduled for a CT with asking him first. Patient also stated that he has not even taken the antibiotic yet and he will not be in town either to get a follow up scheduled. Patient is wanting CT scan cancelled.Please advise.

## 2023-06-06 ENCOUNTER — Ambulatory Visit (HOSPITAL_COMMUNITY)

## 2023-06-27 ENCOUNTER — Ambulatory Visit: Payer: Self-pay | Admitting: Gastroenterology
